# Patient Record
Sex: Male | Born: 1980 | Hispanic: Yes | Marital: Married | Smoking: Former smoker
Health system: Southern US, Community
[De-identification: ages and names within clinical notes are randomized; demographics above are authoritative.]

## PROBLEM LIST (undated history)

## (undated) DIAGNOSIS — I10 Essential (primary) hypertension: Secondary | ICD-10-CM

## (undated) DIAGNOSIS — I00 Rheumatic fever without heart involvement: Secondary | ICD-10-CM

## (undated) HISTORY — DX: Rheumatic fever without heart involvement: I00

---

## 2012-04-07 ENCOUNTER — Emergency Department (HOSPITAL_COMMUNITY)
Admission: EM | Admit: 2012-04-07 | Discharge: 2012-04-07 | Disposition: A | Discharge status: Home / Self Care | Payer: Self-pay | Attending: Emergency Medicine | Admitting: Emergency Medicine

## 2012-04-07 ENCOUNTER — Emergency Department (HOSPITAL_COMMUNITY): Payer: Self-pay

## 2012-04-07 ENCOUNTER — Encounter (HOSPITAL_COMMUNITY): Payer: Self-pay | Admitting: Emergency Medicine

## 2012-04-07 DIAGNOSIS — R112 Nausea with vomiting, unspecified: Secondary | ICD-10-CM | POA: Insufficient documentation

## 2012-04-07 DIAGNOSIS — R1032 Left lower quadrant pain: Secondary | ICD-10-CM | POA: Insufficient documentation

## 2012-04-07 DIAGNOSIS — R824 Acetonuria: Secondary | ICD-10-CM

## 2012-04-07 DIAGNOSIS — D72829 Elevated white blood cell count, unspecified: Secondary | ICD-10-CM

## 2012-04-07 DIAGNOSIS — R197 Diarrhea, unspecified: Secondary | ICD-10-CM | POA: Insufficient documentation

## 2012-04-07 LAB — CBC WITH DIFFERENTIAL/PLATELET
Basophils Relative: 0 % (ref 0–1)
Eosinophils Absolute: 0 10*3/uL (ref 0.0–0.7)
HCT: 50.9 % (ref 39.0–52.0)
Hemoglobin: 17 g/dL (ref 13.0–17.0)
MCH: 30 pg (ref 26.0–34.0)
MCHC: 33.4 g/dL (ref 30.0–36.0)
Monocytes Absolute: 0.9 10*3/uL (ref 0.1–1.0)
Monocytes Relative: 5 % (ref 3–12)

## 2012-04-07 LAB — URINALYSIS, ROUTINE W REFLEX MICROSCOPIC
Bilirubin Urine: NEGATIVE
Glucose, UA: 100 mg/dL — AB
Hgb urine dipstick: NEGATIVE
Ketones, ur: 15 mg/dL — AB
pH: 8 (ref 5.0–8.0)

## 2012-04-07 LAB — COMPREHENSIVE METABOLIC PANEL
Albumin: 4.9 g/dL (ref 3.5–5.2)
BUN: 12 mg/dL (ref 6–23)
Chloride: 101 mEq/L (ref 96–112)
Creatinine, Ser: 0.76 mg/dL (ref 0.50–1.35)
GFR calc Af Amer: >90 mL/min (ref >90)
Glucose, Bld: 150 mg/dL — ABNORMAL HIGH (ref 70–99)
Total Bilirubin: 0.4 mg/dL (ref 0.3–1.2)
Total Protein: 8.4 g/dL — ABNORMAL HIGH (ref 6.0–8.3)

## 2012-04-07 LAB — URINE MICROSCOPIC-ADD ON

## 2012-04-07 LAB — LIPASE, BLOOD: Lipase: 18 U/L (ref 11–59)

## 2012-04-07 MED ORDER — ONDANSETRON HCL 4 MG/2ML IJ SOLN
INTRAMUSCULAR | Status: AC
  Filled 2012-04-07: qty 2

## 2012-04-07 MED ORDER — ONDANSETRON HCL 4 MG PO TABS: 4.0000 mg | ORAL_TABLET | Freq: Four times a day (QID) | ORAL | Status: AC

## 2012-04-07 MED ORDER — HYDROCODONE-ACETAMINOPHEN 5-325 MG PO TABS: 1.0000 | ORAL_TABLET | ORAL | Status: AC | PRN

## 2012-04-07 MED ORDER — ONDANSETRON HCL 4 MG/2ML IJ SOLN
4.0000 mg | Freq: Once | INTRAMUSCULAR | Status: AC
  Administered 2012-04-07: 4 mg via INTRAVENOUS
  Filled 2012-04-07: qty 2

## 2012-04-07 MED ORDER — ONDANSETRON HCL 4 MG/2ML IJ SOLN
4.0000 mg | Freq: Once | INTRAMUSCULAR | Status: AC
  Administered 2012-04-07: 4 mg via INTRAVENOUS

## 2012-04-07 MED ORDER — IOHEXOL 300 MG/ML  SOLN: 20.0000 mL | INTRAMUSCULAR | Status: AC

## 2012-04-07 MED ORDER — MORPHINE SULFATE 4 MG/ML IJ SOLN
4.0000 mg | Freq: Once | INTRAMUSCULAR | Status: AC
  Administered 2012-04-07: 4 mg via INTRAVENOUS
  Filled 2012-04-07: qty 1

## 2012-04-07 MED ORDER — HYDROMORPHONE HCL PF 1 MG/ML IJ SOLN
1.0000 mg | Freq: Once | INTRAMUSCULAR | Status: AC
  Administered 2012-04-07: 1 mg via INTRAVENOUS
  Filled 2012-04-07: qty 1

## 2012-04-07 MED ORDER — IOHEXOL 300 MG/ML  SOLN
100.0000 mL | Freq: Once | INTRAMUSCULAR | Status: AC | PRN
  Administered 2012-04-07: 100 mL via INTRAVENOUS

## 2012-04-07 NOTE — ED Notes (Signed)
Family asked for pt lab results/ advised that I would Notified PA to consult to pt

## 2012-04-07 NOTE — ED Notes (Signed)
Oral contrast given to patient

## 2012-04-07 NOTE — ED Notes (Signed)
Pt writhing in chair

## 2012-04-07 NOTE — ED Notes (Signed)
abd pain cramping and vomiting bad since last night

## 2012-04-07 NOTE — ED Provider Notes (Signed)
Pain is improved with Dilaudid. Patient confirms history of onset of LLQ pain last night, waxing/waning, N, V. NO fever. No history of similar symptoms.   Waiting for CT ABd.  9:20 - No further pain medications required. He had some vomiting with CT CM x 1.Abd. nontender on re-evaluation. CT negative. Patient can be discharged home.  Rodena Medin, PA-C 04/07/12 2124

## 2012-04-07 NOTE — ED Provider Notes (Signed)
History     CSN: [id number]  Arrival date & time 04/07/12  1348   First MD Initiated Contact with Patient 04/07/12 1436      Chief Complaint  Patient presents with  . Abdominal Pain    (Consider location/radiation/quality/duration/timing/severity/associated sxs/prior treatment) HPI Comments: Joshua Hansen 31 y.o. male   The chief complaint is: Patient presents with:   Abdominal Pain   The patient has medical history significant for:   History reviewed. No pertinent past medical history.  Patient presents with abdominal pain that began last night. Patient states the pain is diffuse but mostly in the LLQ. Patient reports the pain as 10/10 without radiation or transmision. Associated symptoms include subjective fever, chills diaphoresis, vomiting X 10, and diarrhea X 3. Denies hematemesis, hematochezia, or melena. Denies dysuria, hematuria, or flank pain.     The history is provided by the patient and a significant other. The history is limited by a language barrier. A language interpreter was used (Myself and the girlfriend).    History reviewed. No pertinent past medical history.  History reviewed. No pertinent past surgical history.  No family history on file.  History  Substance Use Topics  . Smoking status: Never Smoker   . Smokeless tobacco: Not on file  . Alcohol Use: Yes      Review of Systems  Constitutional: Positive for fever, chills and diaphoresis.  Gastrointestinal: Positive for nausea, vomiting, abdominal pain and diarrhea. Negative for anal bleeding.  Genitourinary: Negative for dysuria, urgency, flank pain and difficulty urinating.  All other systems reviewed and are negative.    Allergies  Review of patient's allergies indicates no known allergies.  Home Medications   Current Outpatient Rx  Name Route Sig Dispense Refill  . BISMUTH SUBSALICYLATE 262 MG/15ML PO SUSP Oral Take 30 mLs by mouth once. Abdominal pain      BP 151/88  Pulse  82  Temp 98.2 F (36.8 C)  Resp 16  SpO2 100%  Physical Exam  Constitutional: He appears well-developed and well-nourished. He appears distressed.       Patient retching and holding abdomen.  HENT:  Head: Normocephalic and atraumatic.  Mouth/Throat: Oropharynx is clear and moist.  Eyes: Conjunctivae and EOM are normal. No scleral icterus.  Neck: Normal range of motion. Neck supple.  Cardiovascular: Normal rate, regular rhythm and normal heart sounds.   Pulmonary/Chest: Effort normal and breath sounds normal.  Abdominal: Soft. He exhibits no distension and no mass. There is tenderness in the left lower quadrant. There is guarding. There is no rigidity, no rebound, no CVA tenderness and no tenderness at McBurney's point.    Neurological: He is alert.  Skin: Skin is warm and dry.    ED Course  Procedures (including critical care time)  Labs Reviewed  URINALYSIS, ROUTINE W REFLEX MICROSCOPIC - Abnormal; Notable for the following:    APPearance HAZY (*)     Glucose, UA 100 (*)     Ketones, ur 15 (*)     Protein, ur 30 (*)     Leukocytes, UA SMALL (*)     All other components within normal limits  CBC WITH DIFFERENTIAL - Abnormal; Notable for the following:    WBC 18.1 (*)     Neutrophils Relative 86 (*)     Neutro Abs 15.5 (*)     Lymphocytes Relative 9 (*)     All other components within normal limits  COMPREHENSIVE METABOLIC PANEL - Abnormal; Notable for the following:  CO2 18 (*)     Glucose, Bld 150 (*)     Total Protein 8.4 (*)     All other components within normal limits  URINE MICROSCOPIC-ADD ON - Abnormal; Notable for the following:    Bacteria, UA FEW (*)     All other components within normal limits  LIPASE, BLOOD   Results for orders placed during the hospital encounter of 04/07/12  URINALYSIS, ROUTINE W REFLEX MICROSCOPIC      Component Value Range   Color, Urine YELLOW  YELLOW   APPearance HAZY (*) CLEAR   Specific Gravity, Urine 1.025  1.005 - 1.030     pH 8.0  5.0 - 8.0   Glucose, UA 100 (*) NEGATIVE mg/dL   Hgb urine dipstick NEGATIVE  NEGATIVE   Bilirubin Urine NEGATIVE  NEGATIVE   Ketones, ur 15 (*) NEGATIVE mg/dL   Protein, ur 30 (*) NEGATIVE mg/dL   Urobilinogen, UA 0.2  0.0 - 1.0 mg/dL   Nitrite NEGATIVE  NEGATIVE   Leukocytes, UA SMALL (*) NEGATIVE  CBC WITH DIFFERENTIAL      Component Value Range   WBC 18.1 (*) 4.0 - 10.5 K/uL   RBC 5.66  4.22 - 5.81 MIL/uL   Hemoglobin 17.0  13.0 - 17.0 g/dL   HCT 16.1  09.6 - 04.5 %   MCV 89.9  78.0 - 100.0 fL   MCH 30.0  26.0 - 34.0 pg   MCHC 33.4  30.0 - 36.0 g/dL   RDW 40.9  81.1 - 91.4 %   Platelets 274  150 - 400 K/uL   Neutrophils Relative 86 (*) 43 - 77 %   Neutro Abs 15.5 (*) 1.7 - 7.7 K/uL   Lymphocytes Relative 9 (*) 12 - 46 %   Lymphs Abs 1.6  0.7 - 4.0 K/uL   Monocytes Relative 5  3 - 12 %   Monocytes Absolute 0.9  0.1 - 1.0 K/uL   Eosinophils Relative 0  0 - 5 %   Eosinophils Absolute 0.0  0.0 - 0.7 K/uL   Basophils Relative 0  0 - 1 %   Basophils Absolute 0.0  0.0 - 0.1 K/uL  COMPREHENSIVE METABOLIC PANEL      Component Value Range   Sodium 137  135 - 145 mEq/L   Potassium 3.6  3.5 - 5.1 mEq/L   Chloride 101  96 - 112 mEq/L   CO2 18 (*) 19 - 32 mEq/L   Glucose, Bld 150 (*) 70 - 99 mg/dL   BUN 12  6 - 23 mg/dL   Creatinine, Ser 7.82  0.50 - 1.35 mg/dL   Calcium 95.6  8.4 - 21.3 mg/dL   Total Protein 8.4 (*) 6.0 - 8.3 g/dL   Albumin 4.9  3.5 - 5.2 g/dL   AST 17  0 - 37 U/L   ALT 23  0 - 53 U/L   Alkaline Phosphatase 78  39 - 117 U/L   Total Bilirubin 0.4  0.3 - 1.2 mg/dL   GFR calc non Af Amer >90  >90 mL/min   GFR calc Af Amer >90  >90 mL/min  URINE MICROSCOPIC-ADD ON      Component Value Range   WBC, UA 7-10  <3 WBC/hpf   RBC / HPF 0-2  <3 RBC/hpf   Bacteria, UA FEW (*) RARE   Urine-Other MUCOUS PRESENT      No results found.   1. Abdominal pain, left lower quadrant   2. Leukocytosis  3. Urine ketones       MDM  Patient presented with  nausea and abdominal pain 10/10. Patient given morphine, dilaudid, and zofran. Patient also given IV fluids. CMP: unremarkable. UA: remarkable for dehydration. CBC: remarkable for leukocytosis. Lipase: pending. CT abdomen pelvis with contrast pending. Patient transferred to CDU per recommendation of Dr. Jeraldine Loots and Elpidio Anis, PA-C will assume care while patient waits on imaging.         Pixie Casino, PA-C 04/07/12 1624

## 2012-04-07 NOTE — ED Notes (Signed)
Patient states pain intermittent at rest and at times pain is 0/10 when pain returned pain increases to 8-10/10 achy sharp entire abdomen.  Currently resting on stretcher and pain is 0/10.  Girlfriend at bedside.

## 2012-04-07 NOTE — ED Notes (Signed)
Pt is back in room from radiology 

## 2012-04-07 NOTE — ED Notes (Signed)
Called CT will be sending oral contrast shortly

## 2012-04-07 NOTE — ED Notes (Signed)
Pt denies CP/SOB. 

## 2012-04-07 NOTE — ED Notes (Signed)
CT called to check to see if pt finished Contrast/ advised that he has and they could come transport him to radiology

## 2012-04-07 NOTE — ED Provider Notes (Signed)
Medical screening examination/treatment/procedure(s) were conducted as a shared visit with non-physician practitioner(s) and myself.  I personally evaluated the patient during the encounter   Gerhard Munch, MD 04/07/12 [phone]

## 2012-04-08 NOTE — ED Provider Notes (Signed)
Please see the initial treatment note.  I was available for consult for this patient who I sent to the CDU for completion of his evaluation for new abdominal pain.  Gerhard Munch, MD 04/08/12 [phone]

## 2018-08-06 ENCOUNTER — Other Ambulatory Visit: Payer: Self-pay

## 2018-08-06 ENCOUNTER — Inpatient Hospital Stay (HOSPITAL_COMMUNITY): Payer: No Typology Code available for payment source | Admitting: Certified Registered"

## 2018-08-06 ENCOUNTER — Emergency Department (HOSPITAL_COMMUNITY): Payer: No Typology Code available for payment source

## 2018-08-06 ENCOUNTER — Inpatient Hospital Stay (HOSPITAL_COMMUNITY)
Admission: EM | Admit: 2018-08-06 | Discharge: 2018-08-07 | DRG: 958 | Disposition: A | Discharge status: Other Acute Inpatient Hospital | Payer: No Typology Code available for payment source | Attending: General Surgery | Admitting: General Surgery

## 2018-08-06 ENCOUNTER — Inpatient Hospital Stay (HOSPITAL_COMMUNITY): Payer: No Typology Code available for payment source

## 2018-08-06 ENCOUNTER — Encounter (HOSPITAL_COMMUNITY)
Admission: EM | Disposition: A | Discharge status: Other Acute Inpatient Hospital | Payer: Self-pay | Source: Home / Self Care

## 2018-08-06 ENCOUNTER — Encounter (HOSPITAL_COMMUNITY): Payer: Self-pay | Admitting: Emergency Medicine

## 2018-08-06 DIAGNOSIS — S82202B Unspecified fracture of shaft of left tibia, initial encounter for open fracture type I or II: Secondary | ICD-10-CM | POA: Diagnosis present

## 2018-08-06 DIAGNOSIS — Z419 Encounter for procedure for purposes other than remedying health state, unspecified: Secondary | ICD-10-CM

## 2018-08-06 DIAGNOSIS — S82452C Displaced comminuted fracture of shaft of left fibula, initial encounter for open fracture type IIIA, IIIB, or IIIC: Secondary | ICD-10-CM | POA: Diagnosis present

## 2018-08-06 DIAGNOSIS — I10 Essential (primary) hypertension: Secondary | ICD-10-CM | POA: Diagnosis present

## 2018-08-06 DIAGNOSIS — S82392C Other fracture of lower end of left tibia, initial encounter for open fracture type IIIA, IIIB, or IIIC: Secondary | ICD-10-CM

## 2018-08-06 DIAGNOSIS — S82232C Displaced oblique fracture of shaft of left tibia, initial encounter for open fracture type IIIA, IIIB, or IIIC: Secondary | ICD-10-CM

## 2018-08-06 DIAGNOSIS — S36522A Contusion of descending [left] colon, initial encounter: Secondary | ICD-10-CM | POA: Diagnosis present

## 2018-08-06 DIAGNOSIS — S301XXA Contusion of abdominal wall, initial encounter: Secondary | ICD-10-CM | POA: Diagnosis present

## 2018-08-06 DIAGNOSIS — Y92410 Unspecified street and highway as the place of occurrence of the external cause: Secondary | ICD-10-CM

## 2018-08-06 DIAGNOSIS — S82192C Other fracture of upper end of left tibia, initial encounter for open fracture type IIIA, IIIB, or IIIC: Secondary | ICD-10-CM | POA: Diagnosis present

## 2018-08-06 DIAGNOSIS — S42255A Nondisplaced fracture of greater tuberosity of left humerus, initial encounter for closed fracture: Principal | ICD-10-CM | POA: Diagnosis present

## 2018-08-06 DIAGNOSIS — S36529A Contusion of unspecified part of colon, initial encounter: Secondary | ICD-10-CM

## 2018-08-06 DIAGNOSIS — S82302C Unspecified fracture of lower end of left tibia, initial encounter for open fracture type IIIA, IIIB, or IIIC: Secondary | ICD-10-CM | POA: Diagnosis present

## 2018-08-06 DIAGNOSIS — Z23 Encounter for immunization: Secondary | ICD-10-CM

## 2018-08-06 DIAGNOSIS — Z79899 Other long term (current) drug therapy: Secondary | ICD-10-CM

## 2018-08-06 HISTORY — PX: I & D EXTREMITY: SHX5045

## 2018-08-06 HISTORY — PX: TIBIA IM NAIL INSERTION: SHX2516

## 2018-08-06 HISTORY — DX: Essential (primary) hypertension: I10

## 2018-08-06 HISTORY — DX: Unspecified fracture of shaft of left tibia, initial encounter for open fracture type I or II: S82.202B

## 2018-08-06 LAB — URINALYSIS, ROUTINE W REFLEX MICROSCOPIC
BACTERIA UA: NONE SEEN
Bilirubin Urine: NEGATIVE
Glucose, UA: NEGATIVE mg/dL
Ketones, ur: 5 mg/dL — AB
LEUKOCYTES UA: NEGATIVE
Nitrite: NEGATIVE
Protein, ur: NEGATIVE mg/dL
SPECIFIC GRAVITY, URINE: 1.035 — AB (ref 1.005–1.030)
pH: 5 (ref 5.0–8.0)

## 2018-08-06 LAB — I-STAT CHEM 8, ED
BUN: 20 mg/dL (ref 6–20)
Calcium, Ion: 1.12 mmol/L — ABNORMAL LOW (ref 1.15–1.40)
Chloride: 105 mmol/L (ref 98–111)
Creatinine, Ser: 1 mg/dL (ref 0.61–1.24)
Glucose, Bld: 148 mg/dL — ABNORMAL HIGH (ref 70–99)
HCT: 47 % (ref 39.0–52.0)
Hemoglobin: 16 g/dL (ref 13.0–17.0)
Potassium: 3.1 mmol/L — ABNORMAL LOW (ref 3.5–5.1)
Sodium: 140 mmol/L (ref 135–145)
TCO2: 23 mmol/L (ref 22–32)

## 2018-08-06 LAB — COMPREHENSIVE METABOLIC PANEL
ALT: 31 U/L (ref 0–44)
AST: 39 U/L (ref 15–41)
Albumin: 4.1 g/dL (ref 3.5–5.0)
Alkaline Phosphatase: 50 U/L (ref 38–126)
Anion gap: 11 (ref 5–15)
BUN: 16 mg/dL (ref 6–20)
CO2: 22 mmol/L (ref 22–32)
Calcium: 9 mg/dL (ref 8.9–10.3)
Chloride: 107 mmol/L (ref 98–111)
Creatinine, Ser: 1.13 mg/dL (ref 0.61–1.24)
GFR calc Af Amer: >60 mL/min (ref >60)
GFR calc non Af Amer: >60 mL/min (ref >60)
Glucose, Bld: 148 mg/dL — ABNORMAL HIGH (ref 70–99)
Potassium: 3.1 mmol/L — ABNORMAL LOW (ref 3.5–5.1)
Sodium: 140 mmol/L (ref 135–145)
Total Bilirubin: 0.6 mg/dL (ref 0.3–1.2)
Total Protein: 7 g/dL (ref 6.5–8.1)

## 2018-08-06 LAB — ETHANOL: Alcohol, Ethyl (B): <10 mg/dL (ref <10)

## 2018-08-06 LAB — CDS SEROLOGY

## 2018-08-06 LAB — SAMPLE TO BLOOD BANK

## 2018-08-06 LAB — PROTIME-INR
INR: 1.16
Prothrombin Time: 14.7 seconds (ref 11.4–15.2)

## 2018-08-06 LAB — I-STAT CG4 LACTIC ACID, ED: Lactic Acid, Venous: 3.99 mmol/L (ref 0.5–1.9)

## 2018-08-06 LAB — CBC
HEMATOCRIT: 47 % (ref 39.0–52.0)
Hemoglobin: 14.6 g/dL (ref 13.0–17.0)
MCH: 28.9 pg (ref 26.0–34.0)
MCHC: 31.1 g/dL (ref 30.0–36.0)
MCV: 93.1 fL (ref 80.0–100.0)
Platelets: 441 10*3/uL — ABNORMAL HIGH (ref 150–400)
RBC: 5.05 MIL/uL (ref 4.22–5.81)
RDW: 13.1 % (ref 11.5–15.5)
WBC: 29.7 10*3/uL — ABNORMAL HIGH (ref 4.0–10.5)
nRBC: 0 % (ref 0.0–0.2)

## 2018-08-06 SURGERY — IRRIGATION AND DEBRIDEMENT EXTREMITY
Anesthesia: General | Laterality: Left

## 2018-08-06 MED ORDER — SUCCINYLCHOLINE CHLORIDE 200 MG/10ML IV SOSY
PREFILLED_SYRINGE | INTRAVENOUS | Status: AC
  Filled 2018-08-06: qty 10

## 2018-08-06 MED ORDER — HYDROMORPHONE HCL 1 MG/ML IJ SOLN
0.2500 mg | INTRAMUSCULAR | Status: DC | PRN
  Administered 2018-08-07 (×2): 0.5 mg via INTRAVENOUS

## 2018-08-06 MED ORDER — SUFENTANIL CITRATE 50 MCG/ML IV SOLN
INTRAVENOUS | Status: DC | PRN
  Administered 2018-08-06: 20 ug via INTRAVENOUS
  Administered 2018-08-06: 10 ug via INTRAVENOUS

## 2018-08-06 MED ORDER — EPHEDRINE SULFATE 50 MG/ML IJ SOLN
INTRAMUSCULAR | Status: DC | PRN
  Administered 2018-08-06 (×2): 10 mg via INTRAVENOUS

## 2018-08-06 MED ORDER — IOHEXOL 300 MG/ML  SOLN
100.0000 mL | Freq: Once | INTRAMUSCULAR | Status: AC | PRN
  Administered 2018-08-06: 100 mL via INTRAVENOUS

## 2018-08-06 MED ORDER — PROPOFOL 10 MG/ML IV BOLUS
INTRAVENOUS | Status: DC | PRN
  Administered 2018-08-06: 200 mg via INTRAVENOUS

## 2018-08-06 MED ORDER — GLYCOPYRROLATE 0.2 MG/ML IJ SOLN
INTRAMUSCULAR | Status: DC | PRN
  Administered 2018-08-06: 0.2 mg via INTRAVENOUS

## 2018-08-06 MED ORDER — LORAZEPAM 2 MG/ML IJ SOLN
INTRAMUSCULAR | Status: AC | PRN
  Administered 2018-08-06 (×2): 1 mg via INTRAVENOUS

## 2018-08-06 MED ORDER — ONDANSETRON HCL 4 MG/2ML IJ SOLN
INTRAMUSCULAR | Status: AC
  Filled 2018-08-06: qty 2

## 2018-08-06 MED ORDER — GLYCOPYRROLATE PF 0.2 MG/ML IJ SOSY
PREFILLED_SYRINGE | INTRAMUSCULAR | Status: AC
  Filled 2018-08-06: qty 1

## 2018-08-06 MED ORDER — HYDROMORPHONE HCL 1 MG/ML IJ SOLN
INTRAMUSCULAR | Status: AC | PRN
  Administered 2018-08-06: 1 mg via INTRAVENOUS

## 2018-08-06 MED ORDER — DEXAMETHASONE SODIUM PHOSPHATE 10 MG/ML IJ SOLN
INTRAMUSCULAR | Status: AC
  Filled 2018-08-06: qty 1

## 2018-08-06 MED ORDER — HYDROMORPHONE HCL 1 MG/ML IJ SOLN
INTRAMUSCULAR | Status: AC
  Filled 2018-08-06: qty 1

## 2018-08-06 MED ORDER — SUFENTANIL CITRATE 50 MCG/ML IV SOLN
INTRAVENOUS | Status: AC
  Filled 2018-08-06: qty 1

## 2018-08-06 MED ORDER — SODIUM CHLORIDE 0.9 % IV BOLUS
1000.0000 mL | Freq: Once | INTRAVENOUS | Status: AC
  Administered 2018-08-06: 1000 mL via INTRAVENOUS

## 2018-08-06 MED ORDER — MIDAZOLAM HCL 2 MG/2ML IJ SOLN
INTRAMUSCULAR | Status: AC
  Filled 2018-08-06: qty 2

## 2018-08-06 MED ORDER — ALBUMIN HUMAN 5 % IV SOLN
INTRAVENOUS | Status: DC | PRN
  Administered 2018-08-06: 22:00:00 via INTRAVENOUS

## 2018-08-06 MED ORDER — PROPOFOL 10 MG/ML IV BOLUS
INTRAVENOUS | Status: AC
  Filled 2018-08-06: qty 20

## 2018-08-06 MED ORDER — LACTATED RINGERS IV SOLN
INTRAVENOUS | Status: DC | PRN
  Administered 2018-08-06 (×2): via INTRAVENOUS

## 2018-08-06 MED ORDER — SODIUM CHLORIDE (PF) 0.9 % IJ SOLN
INTRAMUSCULAR | Status: AC
  Filled 2018-08-06: qty 10

## 2018-08-06 MED ORDER — POTASSIUM CHLORIDE IN NACL 20-0.9 MEQ/L-% IV SOLN
INTRAVENOUS | Status: DC
  Administered 2018-08-07: 04:00:00 via INTRAVENOUS
  Filled 2018-08-06 (×2): qty 1000

## 2018-08-06 MED ORDER — MIDAZOLAM HCL 2 MG/2ML IJ SOLN: 0.5000 mg | Freq: Once | INTRAMUSCULAR | Status: DC | PRN

## 2018-08-06 MED ORDER — SODIUM CHLORIDE 0.9 % IR SOLN
Status: DC | PRN
  Administered 2018-08-06 (×2): 3000 mL

## 2018-08-06 MED ORDER — TETANUS-DIPHTH-ACELL PERTUSSIS 5-2.5-18.5 LF-MCG/0.5 IM SUSP
0.5000 mL | Freq: Once | INTRAMUSCULAR | Status: AC
  Administered 2018-08-06: 0.5 mL via INTRAMUSCULAR
  Filled 2018-08-06: qty 0.5

## 2018-08-06 MED ORDER — CEFAZOLIN SODIUM-DEXTROSE 1-4 GM/50ML-% IV SOLN
INTRAVENOUS | Status: DC | PRN
  Administered 2018-08-06: 1 g via INTRAVENOUS

## 2018-08-06 MED ORDER — LORAZEPAM 2 MG/ML IJ SOLN
INTRAMUSCULAR | Status: AC
  Filled 2018-08-06: qty 2

## 2018-08-06 MED ORDER — LIDOCAINE 2% (20 MG/ML) 5 ML SYRINGE
INTRAMUSCULAR | Status: DC | PRN
  Administered 2018-08-06: 100 mg via INTRAVENOUS

## 2018-08-06 MED ORDER — EPHEDRINE 5 MG/ML INJ
INTRAVENOUS | Status: AC
  Filled 2018-08-06: qty 10

## 2018-08-06 MED ORDER — LIDOCAINE 2% (20 MG/ML) 5 ML SYRINGE
INTRAMUSCULAR | Status: AC
  Filled 2018-08-06: qty 5

## 2018-08-06 MED ORDER — HYDROMORPHONE HCL 1 MG/ML IJ SOLN
1.0000 mg | Freq: Once | INTRAMUSCULAR | Status: DC
  Filled 2018-08-06: qty 1

## 2018-08-06 MED ORDER — MEPERIDINE HCL 50 MG/ML IJ SOLN: 6.2500 mg | INTRAMUSCULAR | Status: DC | PRN

## 2018-08-06 MED ORDER — DEXAMETHASONE SODIUM PHOSPHATE 10 MG/ML IJ SOLN
INTRAMUSCULAR | Status: DC | PRN
  Administered 2018-08-06: 10 mg via INTRAVENOUS

## 2018-08-06 MED ORDER — SODIUM CHLORIDE 0.9 % IV SOLN
INTRAVENOUS | Status: AC | PRN
  Administered 2018-08-06: 1000 mL via INTRAVENOUS

## 2018-08-06 MED ORDER — ONDANSETRON HCL 4 MG/2ML IJ SOLN
INTRAMUSCULAR | Status: DC | PRN
  Administered 2018-08-06: 4 mg via INTRAVENOUS

## 2018-08-06 MED ORDER — HYDROMORPHONE HCL 1 MG/ML IJ SOLN
1.0000 mg | Freq: Once | INTRAMUSCULAR | Status: AC
  Administered 2018-08-06: 1 mg via INTRAVENOUS
  Filled 2018-08-06: qty 1

## 2018-08-06 MED ORDER — CEFAZOLIN SODIUM-DEXTROSE 2-4 GM/100ML-% IV SOLN
2.0000 g | Freq: Once | INTRAVENOUS | Status: AC
  Administered 2018-08-06: 2 g via INTRAVENOUS
  Filled 2018-08-06: qty 100

## 2018-08-06 MED ORDER — PROMETHAZINE HCL 25 MG/ML IJ SOLN: 6.2500 mg | INTRAMUSCULAR | Status: DC | PRN

## 2018-08-06 MED ORDER — SUCCINYLCHOLINE CHLORIDE 200 MG/10ML IV SOSY
PREFILLED_SYRINGE | INTRAVENOUS | Status: DC | PRN
  Administered 2018-08-06: 120 mg via INTRAVENOUS

## 2018-08-06 MED ORDER — CEFAZOLIN SODIUM-DEXTROSE 2-4 GM/100ML-% IV SOLN
INTRAVENOUS | Status: AC
  Filled 2018-08-06: qty 100

## 2018-08-06 SURGICAL SUPPLY — 103 items
ALCOHOL 70% 16 OZ (MISCELLANEOUS) IMPLANT
BANDAGE ACE 3X5.8 VEL STRL LF (GAUZE/BANDAGES/DRESSINGS) IMPLANT
BANDAGE ACE 4X5 VEL STRL LF (GAUZE/BANDAGES/DRESSINGS) ×3 IMPLANT
BANDAGE ACE 6X5 VEL STRL LF (GAUZE/BANDAGES/DRESSINGS) ×3 IMPLANT
BANDAGE ELASTIC 4 VELCRO ST LF (GAUZE/BANDAGES/DRESSINGS) ×6 IMPLANT
BANDAGE ELASTIC 6 VELCRO ST LF (GAUZE/BANDAGES/DRESSINGS) ×3 IMPLANT
BANDAGE ESMARK 6X9 LF (GAUZE/BANDAGES/DRESSINGS) ×1 IMPLANT
BIT DRILL CALIBRATED 4.2 (BIT) ×1 IMPLANT
BIT DRILL SHORT 4.2 (BIT) ×2 IMPLANT
BLADE SURG 10 STRL SS (BLADE) ×3 IMPLANT
BNDG COHESIVE 1X5 TAN STRL LF (GAUZE/BANDAGES/DRESSINGS) IMPLANT
BNDG COHESIVE 4X5 TAN STRL (GAUZE/BANDAGES/DRESSINGS) ×3 IMPLANT
BNDG COHESIVE 6X5 TAN STRL LF (GAUZE/BANDAGES/DRESSINGS) ×6 IMPLANT
BNDG CONFORM 3 STRL LF (GAUZE/BANDAGES/DRESSINGS) IMPLANT
BNDG ELASTIC 6X15 VLCR STRL LF (GAUZE/BANDAGES/DRESSINGS) ×3 IMPLANT
BNDG ESMARK 6X9 LF (GAUZE/BANDAGES/DRESSINGS) ×3
BNDG GAUZE STRTCH 6 (GAUZE/BANDAGES/DRESSINGS) ×9 IMPLANT
CORDS BIPOLAR (ELECTRODE) IMPLANT
COVER MAYO STAND STRL (DRAPES) ×3 IMPLANT
COVER SURGICAL LIGHT HANDLE (MISCELLANEOUS) ×3 IMPLANT
COVER WAND RF STERILE (DRAPES) IMPLANT
CUFF TOURNIQUET SINGLE 24IN (TOURNIQUET CUFF) IMPLANT
CUFF TOURNIQUET SINGLE 34IN LL (TOURNIQUET CUFF) IMPLANT
CUFF TOURNIQUET SINGLE 44IN (TOURNIQUET CUFF) IMPLANT
DRAPE C-ARM 42X72 X-RAY (DRAPES) ×3 IMPLANT
DRAPE C-ARMOR (DRAPES) ×3 IMPLANT
DRAPE EXTREMITY BILATERAL (DRAPES) IMPLANT
DRAPE HALF SHEET 40X57 (DRAPES) ×3 IMPLANT
DRAPE IMP U-DRAPE 54X76 (DRAPES) ×3 IMPLANT
DRAPE INCISE IOBAN 66X45 STRL (DRAPES) ×3 IMPLANT
DRAPE POUCH INSTRU U-SHP 10X18 (DRAPES) ×3 IMPLANT
DRAPE SURG 17X23 STRL (DRAPES) IMPLANT
DRAPE U-SHAPE 47X51 STRL (DRAPES) ×3 IMPLANT
DRAPE UTILITY XL STRL (DRAPES) ×6 IMPLANT
DRILL BIT CALIBRATED 4.2 (BIT) ×3
DRILL BIT SHORT 4.2 (BIT) ×4
DRSG PAD ABDOMINAL 8X10 ST (GAUZE/BANDAGES/DRESSINGS) ×9 IMPLANT
DURAPREP 26ML APPLICATOR (WOUND CARE) IMPLANT
ELECT CAUTERY BLADE 6.4 (BLADE) ×3 IMPLANT
ELECT REM PT RETURN 9FT ADLT (ELECTROSURGICAL) ×3
ELECTRODE REM PT RTRN 9FT ADLT (ELECTROSURGICAL) ×1 IMPLANT
FACESHIELD WRAPAROUND (MASK) IMPLANT
GAUZE SPONGE 4X4 12PLY STRL (GAUZE/BANDAGES/DRESSINGS) ×6 IMPLANT
GAUZE XEROFORM 1X8 LF (GAUZE/BANDAGES/DRESSINGS) ×6 IMPLANT
GAUZE XEROFORM 5X9 LF (GAUZE/BANDAGES/DRESSINGS) ×6 IMPLANT
GLOVE BIO SURGEON STRL SZ 6.5 (GLOVE) ×2 IMPLANT
GLOVE BIO SURGEON STRL SZ7.5 (GLOVE) ×9 IMPLANT
GLOVE BIO SURGEONS STRL SZ 6.5 (GLOVE) ×1
GLOVE BIOGEL PI IND STRL 7.0 (GLOVE) ×1 IMPLANT
GLOVE BIOGEL PI IND STRL 8 (GLOVE) ×3 IMPLANT
GLOVE BIOGEL PI INDICATOR 7.0 (GLOVE) ×2
GLOVE BIOGEL PI INDICATOR 8 (GLOVE) ×6
GLOVE ECLIPSE 7.0 STRL STRAW (GLOVE) ×6 IMPLANT
GOWN STRL REUS W/ TWL LRG LVL3 (GOWN DISPOSABLE) ×2 IMPLANT
GOWN STRL REUS W/ TWL XL LVL3 (GOWN DISPOSABLE) ×1 IMPLANT
GOWN STRL REUS W/TWL LRG LVL3 (GOWN DISPOSABLE) ×4
GOWN STRL REUS W/TWL XL LVL3 (GOWN DISPOSABLE) ×2
GUIDEWIRE 3.2X400 (WIRE) ×3 IMPLANT
HANDPIECE INTERPULSE COAX TIP (DISPOSABLE)
KIT BASIN OR (CUSTOM PROCEDURE TRAY) ×3 IMPLANT
KIT TURNOVER KIT B (KITS) ×3 IMPLANT
MANIFOLD NEPTUNE II (INSTRUMENTS) ×3 IMPLANT
NAIL TIBIAL W/PROX BEND 10X345 (Nail) ×3 IMPLANT
NS IRRIG 1000ML POUR BTL (IV SOLUTION) ×6 IMPLANT
PACK ORTHO EXTREMITY (CUSTOM PROCEDURE TRAY) ×3 IMPLANT
PACK TOTAL JOINT (CUSTOM PROCEDURE TRAY) ×3 IMPLANT
PACK UNIVERSAL I (CUSTOM PROCEDURE TRAY) IMPLANT
PAD ABD 8X10 STRL (GAUZE/BANDAGES/DRESSINGS) ×3 IMPLANT
PAD ARMBOARD 7.5X6 YLW CONV (MISCELLANEOUS) ×6 IMPLANT
PAD CAST 4YDX4 CTTN HI CHSV (CAST SUPPLIES) ×3 IMPLANT
PADDING CAST ABS 4INX4YD NS (CAST SUPPLIES) ×4
PADDING CAST ABS COTTON 4X4 ST (CAST SUPPLIES) ×2 IMPLANT
PADDING CAST COTTON 4X4 STRL (CAST SUPPLIES) ×6
PADDING CAST COTTON 6X4 STRL (CAST SUPPLIES) ×3 IMPLANT
REAMER ROD DEEP FLUTE 2.5X950 (INSTRUMENTS) ×3 IMPLANT
SCREW LOCK STAR 5X32 (Screw) ×3 IMPLANT
SCREW LOCK STAR 5X40 (Screw) ×3 IMPLANT
SCREW LOCK STAR 5X42 (Screw) ×3 IMPLANT
SCREW LOCK STAR 5X52 (Screw) ×3 IMPLANT
SET CYSTO W/LG BORE CLAMP LF (SET/KITS/TRAYS/PACK) ×3 IMPLANT
SET HNDPC FAN SPRY TIP SCT (DISPOSABLE) IMPLANT
SPONGE LAP 18X18 X RAY DECT (DISPOSABLE) ×6 IMPLANT
STAPLER SKIN PROX WIDE 3.9 (STAPLE) ×6 IMPLANT
STOCKINETTE IMPERVIOUS 9X36 MD (GAUZE/BANDAGES/DRESSINGS) ×3 IMPLANT
SUT ETHILON 2 0 FS 18 (SUTURE) IMPLANT
SUT ETHILON 2 0 PSLX (SUTURE) IMPLANT
SUT ETHILON 3 0 PS 1 (SUTURE) ×6 IMPLANT
SUT MON AB 2-0 CT1 36 (SUTURE) ×6 IMPLANT
SUT VIC AB 0 CTX 36 (SUTURE) ×2
SUT VIC AB 0 CTX36XBRD ANTBCTR (SUTURE) ×1 IMPLANT
SUT VIC AB 2-0 CT1 36 (SUTURE) IMPLANT
SUT VIC AB 2-0 FS1 27 (SUTURE) IMPLANT
SWAB CULTURE ESWAB REG 1ML (MISCELLANEOUS) IMPLANT
SYR CONTROL 10ML LL (SYRINGE) IMPLANT
TOWEL OR 17X24 6PK STRL BLUE (TOWEL DISPOSABLE) ×3 IMPLANT
TOWEL OR 17X26 10 PK STRL BLUE (TOWEL DISPOSABLE) ×6 IMPLANT
TUBE CONNECTING 12'X1/4 (SUCTIONS) ×1
TUBE CONNECTING 12X1/4 (SUCTIONS) ×2 IMPLANT
TUBE FEEDING ENTERAL 5FR 16IN (TUBING) IMPLANT
TUBING CYSTO DISP (UROLOGICAL SUPPLIES) ×3 IMPLANT
UNDERPAD 30X30 (UNDERPADS AND DIAPERS) ×3 IMPLANT
WATER STERILE IRR 1000ML POUR (IV SOLUTION) ×3 IMPLANT
YANKAUER SUCT BULB TIP NO VENT (SUCTIONS) ×3 IMPLANT

## 2018-08-06 NOTE — Discharge Instructions (Signed)
Orthopedic discharge instructions:  -For the left upper extremity you should maintain your sling at all times other than when bathing, and performing other activities of daily living or doing elbow, hand, and wrist exercises.  You will need to maintain this until your follow-up appointment with Dr. Stann Mainland.  No weightbearing to the left arm.  -For the operative left lower extremity you should participate in only 50% weightbearing.  Following discharge from the hospital you should take a 325 mg aspirin twice daily for 6 weeks for the prevention of blood clots. -Elevate the left lower extremity as much as you can throughout the day with "toes above nose."  Apply ice to the incisions throughout the day as well. -You may remove the postoperative bandages on the fourth postoperative day and reapply standard over-the-counter sterile dressings as well as an Ace wrap to the leg to be changed once per day.  Do not Get the incisions wet until your follow-up appointment.  Follow-up with Dr. Stann Mainland in 2 weeks.

## 2018-08-06 NOTE — ED Notes (Signed)
I Stat [place] Acid result of 3.99 reported to Dr. Darl Householder

## 2018-08-06 NOTE — Anesthesia Procedure Notes (Signed)
Procedure Name: Intubation Date/Time: 08/06/2018 9:39 PM Performed by: Claris Che, CRNA Pre-anesthesia Checklist: Patient identified, Emergency Drugs available, Suction available, Patient being monitored and Timeout performed Patient Re-evaluated:Patient Re-evaluated prior to induction Oxygen Delivery Method: Circle system utilized Preoxygenation: Pre-oxygenation with 100% oxygen Induction Type: IV induction, Rapid sequence and Cricoid Pressure applied Laryngoscope Size: Mac and 3 Grade View: Grade II Tube type: Oral Tube size: 8.0 mm Number of attempts: 1 Airway Equipment and Method: Stylet Placement Confirmation: ETT inserted through vocal cords under direct vision,  positive ETCO2 and breath sounds checked- equal and bilateral Secured at: 24 cm Tube secured with: Tape Dental Injury: Teeth and Oropharynx as per pre-operative assessment
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
## 2018-08-06 NOTE — Progress Notes (Signed)
Orthopedic Tech Progress Note Patient Details:  Joshua Hansen [birth year] [id number]    Shoulder immobilizer with [name] Type of Ortho Device: Shoulder immobilizer, Short leg splint, Stirrup splint Splint Material: Fiberglass Ortho Device/Splint Location: Lt leg Ortho Device/Splint Interventions: Adjustment, Application, Ordered   Post Interventions Patient Tolerated: Well Instructions Provided: Care of device, Adjustment of device   Janit Pagan 08/06/2018, 8:51 PM
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
## 2018-08-06 NOTE — ED Notes (Signed)
Patient transported to CT 

## 2018-08-06 NOTE — ED Notes (Signed)
Pt transported to CT ?

## 2018-08-06 NOTE — ED Notes (Addendum)
Pt's pastor and mother in law at bedside to inform pt of daughter's condition.
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
## 2018-08-06 NOTE — Transfer of Care (Signed)
Immediate Anesthesia Transfer of Care Note  Patient: Joshua Hansen  Procedure(s) Performed: IRRIGATION AND DEBRIDEMENT EXTREMITY (Left ) INTRAMEDULLARY (IM) NAIL TIBIAL (Left )  Patient Location: PACU  Anesthesia Type:General  Level of Consciousness: sedated, drowsy, patient cooperative and responds to stimulation  Airway & Oxygen Therapy: Patient Spontanous Breathing  Post-op Assessment: Report given to RN, Post -op Vital signs reviewed and stable and Patient moving all extremities X 4  Post vital signs: Reviewed and stable  Last Vitals:  Vitals Value Taken Time  BP    Temp    Pulse    Resp    SpO2      Last Pain:  Vitals:   08/06/18 2056  PainSc: Asleep      Patients Stated Pain Goal: 6 (38/18/29 9371)  Complications: No apparent anesthesia complications

## 2018-08-06 NOTE — Progress Notes (Signed)
Orthopedic Tech Progress Note Patient Details:  Joshua Hansen 1981/02/23 [id number] Pt has a left open Tib/ fib and splint was applied to it with assistance from [place] Type of Ortho Device: Ace wrap, Post splint, Post (short leg) splint, Short leg splint, Stirrup splint Splint Material: Fiberglass Ortho Device/Splint Location: Lt leg Ortho Device/Splint Interventions: Application   Post Interventions Patient Tolerated: Well Instructions Provided: Adjustment of device, Care of device   Ladell Pier [facility] 08/06/2018, 6:14 PM

## 2018-08-06 NOTE — ED Notes (Addendum)
Caprice Beaver, pt's mother-in-law would like to leave her number.  Mrs. Idelia Salm notified pt of his daughter's poor prognosis.  She can be reached at [phone].
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
## 2018-08-06 NOTE — Brief Op Note (Signed)
08/06/2018  11:39 PM  PATIENT:  Joshua Hansen  38 y.o. male  PRE-OPERATIVE DIAGNOSIS:  LEFT OPEN TIBIA FRACTURE  POST-OPERATIVE DIAGNOSIS:  Left open tibia fracture  PROCEDURE:  Procedure(s): IRRIGATION AND DEBRIDEMENT EXTREMITY (Left) INTRAMEDULLARY (IM) NAIL TIBIAL (Left)  SURGEON:  Surgeon(s) and Role:    * Nicholes Stairs, MD - Primary  PHYSICIAN ASSISTANT:   ASSISTANTS: none   ANESTHESIA:   general  EBL:  50 mL   BLOOD ADMINISTERED:none  DRAINS: none   LOCAL MEDICATIONS USED:  NONE  SPECIMEN:  No Specimen  DISPOSITION OF SPECIMEN:  N/A  COUNTS:  YES  TOURNIQUET:  * No tourniquets in log *  DICTATION: .Note written in EPIC  PLAN OF CARE: Admit to inpatient   PATIENT DISPOSITION:  PACU - hemodynamically stable.   Delay start of Pharmacological VTE agent (>24hrs) due to surgical blood loss or risk of bleeding: not applicable

## 2018-08-06 NOTE — ED Notes (Signed)
Dr. Thompson at bedside. 
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
## 2018-08-07 ENCOUNTER — Inpatient Hospital Stay (HOSPITAL_COMMUNITY): Payer: No Typology Code available for payment source

## 2018-08-07 ENCOUNTER — Other Ambulatory Visit (HOSPITAL_COMMUNITY): Payer: Self-pay

## 2018-08-07 DIAGNOSIS — S42302A Unspecified fracture of shaft of humerus, left arm, initial encounter for closed fracture: Secondary | ICD-10-CM

## 2018-08-07 DIAGNOSIS — S82202B Unspecified fracture of shaft of left tibia, initial encounter for open fracture type I or II: Secondary | ICD-10-CM

## 2018-08-07 HISTORY — DX: Unspecified fracture of shaft of left tibia, initial encounter for open fracture type I or II: S82.202B

## 2018-08-07 HISTORY — DX: Unspecified fracture of shaft of humerus, left arm, initial encounter for closed fracture: S42.302A

## 2018-08-07 LAB — BASIC METABOLIC PANEL
Anion gap: 7 (ref 5–15)
BUN: 11 mg/dL (ref 6–20)
CO2: 21 mmol/L — ABNORMAL LOW (ref 22–32)
Calcium: 8.1 mg/dL — ABNORMAL LOW (ref 8.9–10.3)
Chloride: 108 mmol/L (ref 98–111)
Creatinine, Ser: 0.88 mg/dL (ref 0.61–1.24)
GFR calc Af Amer: >60 mL/min (ref >60)
GFR calc non Af Amer: >60 mL/min (ref >60)
Glucose, Bld: 191 mg/dL — ABNORMAL HIGH (ref 70–99)
Potassium: 3.7 mmol/L (ref 3.5–5.1)
Sodium: 136 mmol/L (ref 135–145)

## 2018-08-07 LAB — CBC
HCT: 37.9 % — ABNORMAL LOW (ref 39.0–52.0)
Hemoglobin: 12 g/dL — ABNORMAL LOW (ref 13.0–17.0)
MCH: 28.9 pg (ref 26.0–34.0)
MCHC: 31.7 g/dL (ref 30.0–36.0)
MCV: 91.3 fL (ref 80.0–100.0)
Platelets: 284 10*3/uL (ref 150–400)
RBC: 4.15 MIL/uL — ABNORMAL LOW (ref 4.22–5.81)
RDW: 12.9 % (ref 11.5–15.5)
WBC: 16.8 10*3/uL — ABNORMAL HIGH (ref 4.0–10.5)
nRBC: 0 % (ref 0.0–0.2)

## 2018-08-07 LAB — HIV ANTIBODY (ROUTINE TESTING W REFLEX): HIV Screen 4th Generation wRfx: NONREACTIVE

## 2018-08-07 MED ORDER — METOCLOPRAMIDE HCL 5 MG/ML IJ SOLN: 5.0000 mg | Freq: Three times a day (TID) | INTRAMUSCULAR | Status: DC | PRN

## 2018-08-07 MED ORDER — PANTOPRAZOLE SODIUM 40 MG IV SOLR
40.0000 mg | Freq: Every day | INTRAVENOUS | Status: DC
  Filled 2018-08-07: qty 40

## 2018-08-07 MED ORDER — DOCUSATE SODIUM 100 MG PO CAPS
100.0000 mg | ORAL_CAPSULE | Freq: Two times a day (BID) | ORAL | Status: DC
  Filled 2018-08-07: qty 1

## 2018-08-07 MED ORDER — ONDANSETRON 4 MG PO TBDP: 4.0000 mg | ORAL_TABLET | Freq: Four times a day (QID) | ORAL | Status: DC | PRN

## 2018-08-07 MED ORDER — ENOXAPARIN SODIUM 40 MG/0.4ML ~~LOC~~ SOLN: 40.0000 mg | SUBCUTANEOUS | Status: DC

## 2018-08-07 MED ORDER — DOCUSATE SODIUM 100 MG PO CAPS: 100.0000 mg | ORAL_CAPSULE | Freq: Two times a day (BID) | ORAL | 0 refills | Status: DC

## 2018-08-07 MED ORDER — METHOCARBAMOL 1000 MG/10ML IJ SOLN: 1000.0000 mg | Freq: Three times a day (TID) | INTRAVENOUS | Status: DC | PRN

## 2018-08-07 MED ORDER — HYDROMORPHONE HCL 1 MG/ML IJ SOLN: 0.5000 mg | INTRAMUSCULAR | Status: DC | PRN

## 2018-08-07 MED ORDER — METOCLOPRAMIDE HCL 5 MG PO TABS: 5.0000 mg | ORAL_TABLET | Freq: Three times a day (TID) | ORAL | Status: DC | PRN

## 2018-08-07 MED ORDER — HYDROMORPHONE HCL 1 MG/ML IJ SOLN: 1.0000 mg | Freq: Once | INTRAMUSCULAR | 0 refills | Status: AC

## 2018-08-07 MED ORDER — HYDRALAZINE HCL 20 MG/ML IJ SOLN: 10.0000 mg | INTRAMUSCULAR | Status: DC | PRN

## 2018-08-07 MED ORDER — CEFAZOLIN SODIUM-DEXTROSE 2-4 GM/100ML-% IV SOLN
2.0000 g | Freq: Four times a day (QID) | INTRAVENOUS | Status: DC
  Administered 2018-08-07: 2 g via INTRAVENOUS
  Filled 2018-08-07 (×2): qty 100

## 2018-08-07 MED ORDER — ONDANSETRON HCL 4 MG/2ML IJ SOLN: 4.0000 mg | Freq: Four times a day (QID) | INTRAMUSCULAR | Status: DC | PRN

## 2018-08-07 MED ORDER — ONDANSETRON HCL 4 MG PO TABS: 4.0000 mg | ORAL_TABLET | Freq: Four times a day (QID) | ORAL | 0 refills | Status: DC | PRN

## 2018-08-07 MED ORDER — OXYCODONE HCL 5 MG PO TABS: 5.0000 mg | ORAL_TABLET | ORAL | Status: DC | PRN

## 2018-08-07 MED ORDER — CEFAZOLIN SODIUM-DEXTROSE 1-4 GM/50ML-% IV SOLN: 1.0000 g | Freq: Three times a day (TID) | INTRAVENOUS | Status: DC

## 2018-08-07 MED ORDER — HYDROMORPHONE HCL 1 MG/ML IJ SOLN
1.0000 mg | INTRAMUSCULAR | Status: DC | PRN
  Administered 2018-08-07: 1 mg via INTRAVENOUS
  Filled 2018-08-07 (×2): qty 1

## 2018-08-07 MED ORDER — OXYCODONE HCL 5 MG PO TABS: 5.0000 mg | ORAL_TABLET | ORAL | 0 refills | Status: DC | PRN

## 2018-08-07 MED ORDER — HYDROMORPHONE HCL 1 MG/ML IJ SOLN: 1.0000 mg | INTRAMUSCULAR | 0 refills | Status: DC | PRN

## 2018-08-07 MED ORDER — CEFAZOLIN SODIUM-DEXTROSE 2-4 GM/100ML-% IV SOLN: 2.0000 g | Freq: Four times a day (QID) | INTRAVENOUS | Status: DC

## 2018-08-07 MED ORDER — PANTOPRAZOLE SODIUM 40 MG PO TBEC: 40.0000 mg | DELAYED_RELEASE_TABLET | Freq: Every day | ORAL | Status: DC

## 2018-08-07 MED ORDER — ONDANSETRON HCL 4 MG PO TABS: 4.0000 mg | ORAL_TABLET | Freq: Four times a day (QID) | ORAL | Status: DC | PRN

## 2018-08-07 MED ORDER — HYDROMORPHONE HCL 1 MG/ML IJ SOLN: 0.5000 mg | INTRAMUSCULAR | 0 refills | Status: DC | PRN

## 2018-08-07 MED ORDER — ONDANSETRON 4 MG PO TBDP: 4.0000 mg | ORAL_TABLET | Freq: Four times a day (QID) | ORAL | 0 refills | Status: DC | PRN
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
## 2018-08-07 NOTE — Progress Notes (Signed)
Orthopedic Tech Progress Note Patient Details:  Joshua Hansen [birth year] [id number] Applied with [name] Type of Ortho Device: CAM walker Splint Material: Fiberglass Ortho Device/Splint Location: LLE Ortho Device/Splint Interventions: Adjustment, Application, Ordered   Post Interventions Patient Tolerated: Well Instructions Provided: Care of device   Janit Pagan 08/07/2018, 1:44 AM

## 2018-08-07 NOTE — Anesthesia Postprocedure Evaluation (Signed)
Anesthesia Post Note  Patient: Adom A Requejo  Procedure(s) Performed: IRRIGATION AND DEBRIDEMENT EXTREMITY (Left ) INTRAMEDULLARY (IM) NAIL TIBIAL (Left )     Patient location during evaluation: PACU Anesthesia Type: General Level of consciousness: sedated, patient cooperative and oriented Pain management: pain level controlled Vital Signs Assessment: post-procedure vital signs reviewed and stable Respiratory status: spontaneous breathing, nonlabored ventilation and respiratory function stable Cardiovascular status: blood pressure returned to baseline and stable Postop Assessment: no apparent nausea or vomiting Anesthetic complications: no    Last Vitals:  Vitals:   08/06/18 2345 08/07/18 0000  BP: 126/73 138/75  Pulse: 94 (!) 9  Resp: 15 (!) 8  Temp:    SpO2: 96%     Last Pain:  Vitals:   08/06/18 2332  PainSc: Asleep                 Thurmond Hildebran,E. Littie Chiem
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
## 2018-08-07 NOTE — Progress Notes (Signed)
Ems given oral and written report on pt. Assisted with transfer from bed to stretcher and pt en route to [facility]. Family at bedside and made aware.

## 2018-08-08 ENCOUNTER — Encounter (HOSPITAL_COMMUNITY): Payer: Self-pay | Admitting: Orthopedic Surgery

## 2018-08-08 ENCOUNTER — Ambulatory Visit: Payer: Self-pay | Admitting: Medical

## 2018-08-08 DIAGNOSIS — Z0289 Encounter for other administrative examinations: Secondary | ICD-10-CM

## 2018-08-08 MED ORDER — SENNOSIDES-DOCUSATE SODIUM 8.6-50 MG PO TABS
2.00 | ORAL_TABLET | ORAL | Status: DC
Start: 2018-08-10 — End: 2018-08-08

## 2018-08-08 MED ORDER — OXYCODONE-ACETAMINOPHEN 5-325 MG PO TABS
1.00 | ORAL_TABLET | ORAL | Status: DC
Start: ? — End: 2018-08-08

## 2018-08-08 MED ORDER — INFLUENZA VAC SPLIT QUAD 0.5 ML IM SUSY
0.50 | PREFILLED_SYRINGE | INTRAMUSCULAR | Status: DC
Start: ? — End: 2018-08-08

## 2018-08-08 MED ORDER — TRAMADOL HCL 50 MG PO TABS
50.00 | ORAL_TABLET | ORAL | Status: DC
Start: ? — End: 2018-08-08

## 2018-08-08 MED ORDER — ACETAMINOPHEN 325 MG PO TABS
325.00 | ORAL_TABLET | ORAL | Status: DC
Start: 2018-08-11 — End: 2018-08-08

## 2018-08-08 MED ORDER — POLYETHYLENE GLYCOL 3350 17 G PO PACK
17.00 | PACK | ORAL | Status: DC
Start: 2018-08-11 — End: 2018-08-08

## 2018-08-08 MED ORDER — ASPIRIN 325 MG PO TABS
325.00 | ORAL_TABLET | ORAL | Status: DC
Start: 2018-08-08 — End: 2018-08-08

## 2018-08-08 MED ORDER — MELATONIN 3 MG PO TABS
3.00 | ORAL_TABLET | ORAL | Status: DC
Start: ? — End: 2018-08-08

## 2018-08-12 MED ORDER — GABAPENTIN 100 MG PO CAPS
100.00 | ORAL_CAPSULE | ORAL | Status: DC
Start: 2018-08-10 — End: 2018-08-12

## 2018-08-12 MED ORDER — ONDANSETRON 4 MG PO TBDP
4.00 | ORAL_TABLET | ORAL | Status: DC
Start: ? — End: 2018-08-12

## 2018-08-12 MED ORDER — AMLODIPINE BESYLATE 5 MG PO TABS
5.00 | ORAL_TABLET | ORAL | Status: DC
Start: 2018-08-11 — End: 2018-08-12

## 2018-08-12 MED ORDER — ENOXAPARIN SODIUM 60 MG/0.6ML ~~LOC~~ SOLN
.60 | SUBCUTANEOUS | Status: DC
Start: 2018-08-11 — End: 2018-08-12

## 2018-08-12 MED ORDER — LISINOPRIL 20 MG PO TABS
40.00 | ORAL_TABLET | ORAL | Status: DC
Start: 2018-08-11 — End: 2018-08-12

## 2018-08-16 ENCOUNTER — Encounter (HOSPITAL_COMMUNITY): Payer: Self-pay | Admitting: Emergency Medicine

## 2018-08-16 ENCOUNTER — Other Ambulatory Visit: Payer: Self-pay | Admitting: *Deleted

## 2018-08-16 ENCOUNTER — Ambulatory Visit (HOSPITAL_COMMUNITY)
Admission: EM | Admit: 2018-08-16 | Discharge: 2018-08-16 | Disposition: A | Discharge status: Home / Self Care | Payer: No Typology Code available for payment source | Attending: Family Medicine | Admitting: Family Medicine

## 2018-08-16 DIAGNOSIS — F4310 Post-traumatic stress disorder, unspecified: Secondary | ICD-10-CM | POA: Insufficient documentation

## 2018-08-16 DIAGNOSIS — S301XXA Contusion of abdominal wall, initial encounter: Secondary | ICD-10-CM | POA: Insufficient documentation

## 2018-08-16 MED ORDER — TRAMADOL HCL 50 MG PO TABS: 50.0000 mg | ORAL_TABLET | Freq: Four times a day (QID) | ORAL | 0 refills | Status: DC | PRN

## 2018-08-16 MED ORDER — TRAZODONE HCL 50 MG PO TABS: 50.0000 mg | ORAL_TABLET | Freq: Every evening | ORAL | 1 refills | Status: DC | PRN
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
## 2018-08-17 ENCOUNTER — Telehealth: Payer: Self-pay

## 2018-08-17 DIAGNOSIS — S82292B Other fracture of shaft of left tibia, initial encounter for open fracture type I or II: Secondary | ICD-10-CM
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
## 2018-08-17 NOTE — Telephone Encounter (Signed)
NP appt with Percell Miller scheduled.

## 2018-08-17 NOTE — Addendum Note (Signed)
Addended by: Anabel Halon on: 08/17/2018 07:09 PM   Modules accepted: Orders
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
## 2018-08-17 NOTE — Addendum Note (Signed)
Addended by: Kem Boroughs D on: 08/17/2018 12:59 PM   Modules accepted: Orders

## 2018-08-17 NOTE — Telephone Encounter (Signed)
I signed referral. Can you coordinate with [facility]. I signed the referral.

## 2018-08-23 ENCOUNTER — Ambulatory Visit (INDEPENDENT_AMBULATORY_CARE_PROVIDER_SITE_OTHER): Payer: No Typology Code available for payment source | Admitting: Orthopaedic Surgery

## 2018-08-23 ENCOUNTER — Encounter (INDEPENDENT_AMBULATORY_CARE_PROVIDER_SITE_OTHER): Payer: Self-pay

## 2018-08-24 ENCOUNTER — Telehealth: Payer: Self-pay | Admitting: *Deleted

## 2018-08-24 NOTE — Telephone Encounter (Signed)
Received Home Health Discharge/Transfer Summary from [facility]; forwarded to provider [facility] pt on 08/31/18]/SLS 01/21

## 2018-08-31 ENCOUNTER — Encounter: Payer: Self-pay | Admitting: Medical

## 2018-08-31 ENCOUNTER — Ambulatory Visit (INDEPENDENT_AMBULATORY_CARE_PROVIDER_SITE_OTHER): Payer: No Typology Code available for payment source | Admitting: Medical

## 2018-08-31 VITALS — BP 118/76 | HR 74 | Temp 98.1°F | Resp 16 | Ht 69.0 in | Wt 198.8 lb

## 2018-08-31 DIAGNOSIS — G4700 Insomnia, unspecified: Secondary | ICD-10-CM

## 2018-08-31 DIAGNOSIS — F329 Major depressive disorder, single episode, unspecified: Secondary | ICD-10-CM | POA: Diagnosis not present

## 2018-08-31 DIAGNOSIS — I10 Essential (primary) hypertension: Secondary | ICD-10-CM | POA: Diagnosis not present

## 2018-08-31 DIAGNOSIS — F419 Anxiety disorder, unspecified: Secondary | ICD-10-CM

## 2018-08-31 DIAGNOSIS — F32A Depression, unspecified: Secondary | ICD-10-CM

## 2018-08-31 DIAGNOSIS — B351 Tinea unguium: Secondary | ICD-10-CM

## 2018-08-31 MED ORDER — LISINOPRIL 40 MG PO TABS: 40.0000 mg | ORAL_TABLET | Freq: Every day | ORAL | 0 refills | Status: DC

## 2018-08-31 MED ORDER — LISINOPRIL 40 MG PO TABS: 40.0000 mg | ORAL_TABLET | Freq: Every day | ORAL | 3 refills | Status: DC

## 2018-08-31 MED ORDER — SERTRALINE HCL 25 MG PO TABS: 25.0000 mg | ORAL_TABLET | Freq: Every day | ORAL | 0 refills | Status: DC

## 2018-08-31 MED ORDER — AMLODIPINE BESYLATE 5 MG PO TABS: 5.0000 mg | ORAL_TABLET | Freq: Every day | ORAL | 0 refills | Status: DC

## 2018-08-31 MED ORDER — CLONAZEPAM 0.25 MG PO TBDP: ORAL_TABLET | ORAL | 0 refills | Status: DC
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
## 2018-08-31 NOTE — Patient Instructions (Addendum)
For your history of hypertension, I did refill your blood pressure medication today.  For recent depression, anxiety and insomnia, I want you to continue trazodone and will add low-dose sertraline for both anxiety and depression.  Also given prescription of clonazepam to use 1 tablet twice a day but only for severe anxiety or panic attacks.  The less you use clonazepam the the more effective it will be when needed.   If you develop thoughts of any harm to yourself or others and recommend ED evaluation at [facility].  Would recommend that you continue counseling.  You do have probable nail fungus present.  Treatments options discussed today.  We will get metabolic panel today and send sample of your nail for fungal studies.   When studies are back we will decide on prescribing Lamisil.  Follow-up in 2 weeks or as needed.

## 2018-09-01 LAB — COMPREHENSIVE METABOLIC PANEL
ALT: 14 U/L (ref 0–53)
AST: 10 U/L (ref 0–37)
Albumin: 4.5 g/dL (ref 3.5–5.2)
Alkaline Phosphatase: 112 U/L (ref 39–117)
BUN: 14 mg/dL (ref 6–23)
CALCIUM: 9.8 mg/dL (ref 8.4–10.5)
CO2: 26 mEq/L (ref 19–32)
Chloride: 103 mEq/L (ref 96–112)
Creatinine, Ser: 0.87 mg/dL (ref 0.40–1.50)
GFR: 98.58 mL/min (ref >60.00)
Glucose, Bld: 80 mg/dL (ref 70–99)
Potassium: 3.9 mEq/L (ref 3.5–5.1)
Sodium: 138 mEq/L (ref 135–145)
Total Bilirubin: 0.4 mg/dL (ref 0.2–1.2)
Total Protein: 7 g/dL (ref 6.0–8.3)

## 2018-09-02 LAB — FUNGAL STAIN
FUNGAL SMEAR:: NONE SEEN
MICRO NUMBER:: 121914
SPECIMEN QUALITY:: ADEQUATE

## 2018-09-16 ENCOUNTER — Ambulatory Visit (INDEPENDENT_AMBULATORY_CARE_PROVIDER_SITE_OTHER): Payer: No Typology Code available for payment source | Admitting: Medical

## 2018-09-16 ENCOUNTER — Encounter: Payer: Self-pay | Admitting: Medical

## 2018-09-16 VITALS — BP 109/61 | HR 71 | Temp 98.5°F | Resp 16 | Ht 69.0 in | Wt 193.4 lb

## 2018-09-16 DIAGNOSIS — L603 Nail dystrophy: Secondary | ICD-10-CM

## 2018-09-16 DIAGNOSIS — I10 Essential (primary) hypertension: Secondary | ICD-10-CM | POA: Diagnosis not present

## 2018-09-16 DIAGNOSIS — F419 Anxiety disorder, unspecified: Secondary | ICD-10-CM | POA: Diagnosis not present

## 2018-09-16 DIAGNOSIS — F329 Major depressive disorder, single episode, unspecified: Secondary | ICD-10-CM | POA: Diagnosis not present

## 2018-09-16 DIAGNOSIS — F32A Depression, unspecified: Secondary | ICD-10-CM

## 2018-09-16 DIAGNOSIS — B351 Tinea unguium: Secondary | ICD-10-CM

## 2018-09-16 MED ORDER — SERTRALINE HCL 50 MG PO TABS: 50.0000 mg | ORAL_TABLET | Freq: Every day | ORAL | 0 refills | Status: DC

## 2018-09-16 MED ORDER — CLONAZEPAM 0.5 MG PO TABS: 0.5000 mg | ORAL_TABLET | Freq: Two times a day (BID) | ORAL | 0 refills | Status: DC | PRN

## 2018-09-16 NOTE — Patient Instructions (Addendum)
You report feeling about the same level of anxiety depression as last time.  This is despite use of low-dose clonazepam and low-dose sertraline.  I want you to increase the clonazepam to 0.5 mg twice daily as needed.  Also increase sertraline to 50 mg daily.  I have to advise against using kava in addition to the above meds.  Please update me via my chart if doubling the dosing has helped his mood.  The effec of  clonazepam should be immediate but effect of sertraline might be delayed for up to 2 weeks.  Lower side bp  today. Recommend get bp cuff at [facility]. Check daily If less than 120/80 then just take amlodipine 2.5 mg. But continue the lisinopril  Follow-up in 2 to 3 weeks or as needed.(If you send me a my chart message at the 2-week mark I can let you know when your next appointment should be.)

## 2018-09-16 NOTE — Progress Notes (Signed)
Subjective:    Patient ID: Joshua Hansen, male    DOB: 1980-09-22, 38 y.o.   MRN: [id number]  HPI  Pt in for follow up.  Pt states he is still feeling anxious. He states klonopin helps a little. He only used briefly. He still has 4 tab left. He was given 8 tablets.   He did use kava which his wife got over the counter but it did not last long.  I also gave him sertraline low dose. He still feels depressed about the same as before.  Not sleeping well at night.  Pt is still meeting with counselor.  Hx of htn in past. He is on 2 meds. Recently his bp had been in lower side in 110/60 range. No dizziness on standing. Has no bp cuff. On 2 meds.   Review of Systems  Constitutional: Negative for chills, fatigue and fever.  Respiratory: Negative for cough, chest tightness, shortness of breath and wheezing.   Cardiovascular: Negative for chest pain and palpitations.  Gastrointestinal: Negative for abdominal pain, constipation and diarrhea.  Musculoskeletal: Negative for back pain.  Skin: Negative for rash.  Neurological: Negative for dizziness, syncope, numbness and headaches.  Hematological: Negative for adenopathy. Does not bruise/bleed easily.  Psychiatric/Behavioral: Positive for dysphoric mood and sleep disturbance. Negative for agitation, behavioral problems, hallucinations and suicidal ideas. The patient is nervous/anxious. The patient is not hyperactive.     Past Medical History:  Diagnosis Date  . Hypertension   . Rheumatic fever    as child     Social History   Socioeconomic History  . Marital status: Single    Spouse name: Not on file  . Number of children: Not on file  . Years of education: Not on file  . Highest education level: Not on file  Occupational History  . Not on file  Social Needs  . Financial resource strain: Not on file  . Food insecurity:    Worry: Not on file    Inability: Not on file  . Transportation needs:    Medical: Not on file   Non-medical: Not on file  Tobacco Use  . Smoking status: Never Smoker  . Smokeless tobacco: Never Used  Substance and Sexual Activity  . Alcohol use: Not Currently    Frequency: Never  . Drug use: Not on file  . Sexual activity: Not on file  Lifestyle  . Physical activity:    Days per week: Not on file    Minutes per session: Not on file  . Stress: Not on file  Relationships  . Social connections:    Talks on phone: Not on file    Gets together: Not on file    Attends religious service: Not on file    Active member of club or organization: Not on file    Attends meetings of clubs or organizations: Not on file    Relationship status: Not on file  . Intimate partner violence:    Fear of current or ex partner: Not on file    Emotionally abused: Not on file    Physically abused: Not on file    Forced sexual activity: Not on file  Other Topics Concern  . Not on file  Social History Narrative  . Not on file    Past Surgical History:  Procedure Laterality Date  . I&D EXTREMITY Left 08/06/2018   Procedure: IRRIGATION AND DEBRIDEMENT EXTREMITY;  Surgeon: Nicholes Stairs, MD;  Location: [place];  Service: Orthopedics;  Laterality: Left;  . TIBIA IM NAIL INSERTION Left 08/06/2018   Procedure: INTRAMEDULLARY (IM) NAIL TIBIAL;  Surgeon: Nicholes Stairs, MD;  Location: [place];  Service: Orthopedics;  Laterality: Left;    No family history on file.  No Known Allergies  Current Outpatient Medications on File Prior to Visit  Medication Sig Dispense Refill  . amLODipine (NORVASC) 5 MG tablet Take 1 tablet (5 mg total) by mouth daily. 90 tablet 0  . clonazePAM (KLONOPIN) 0.25 MG disintegrating tablet 1 tab po bid as needed anxiety/panic attack 8 tablet 0  . lisinopril (PRINIVIL,ZESTRIL) 40 MG tablet Take 1 tablet (40 mg total) by mouth daily. 90 tablet 3  . metoCLOPramide (REGLAN) 5 MG tablet Take 1-2 tablets (5-10 mg total) by mouth every 8 (eight) hours as needed for nausea (if  ondansetron (ZOFRAN) ineffective.).    Marland Kitchen sertraline (ZOLOFT) 25 MG tablet Take 1 tablet (25 mg total) by mouth daily. 30 tablet 0  . traMADol (ULTRAM) 50 MG tablet Take 1 tablet (50 mg total) by mouth every 6 (six) hours as needed. 15 tablet 0  . traZODone (DESYREL) 50 MG tablet Take 1 tablet (50 mg total) by mouth at bedtime as needed for sleep. 30 tablet 1   No current facility-administered medications on file prior to visit.     BP 109/61   Pulse 71   Temp 98.5 F (36.9 C) (Oral)   Resp 16   Ht 5\' 9"  (1.753 m)   Wt 193 lb 6.4 oz (87.7 kg)   SpO2 100%   BMI 28.56 kg/m       Objective:   Physical Exam  General Mental Status- Alert. General Appearance- Not in acute distress.   Skin General: Color- Normal Color. Moisture- Normal Moisture.  Neck Carotid Arteries- Normal color. Moisture- Normal Moisture. No carotid bruits. No JVD.  Chest and Lung Exam Auscultation: Breath Sounds:-Normal.  Cardiovascular Auscultation:Rythm- Regular. Murmurs & Other Heart Sounds:Auscultation of the heart reveals- No Murmurs.  Abdomen Inspection:-Inspeection Normal. Palpation/Percussion:Note:No mass. Palpation and Percussion of the abdomen reveal- Non Tender, Non Distended + BS, no rebound or guarding.   Neurologic Cranial Nerve exam:- CN III-XII intact(No nystagmus), symmetric smile. Strength:- 5/5 equal and symmetric strength both upper and lower extremities.     Assessment & Plan:  You report feeling about the same level of anxiety depression as last time.  This is despite use of low-dose clonazepam and low-dose sertraline.  I want you to increase the clonazepam to 0.5 mg twice daily as needed.  Also increase sertraline to 50 mg daily.  I have to advise against using kava in addition to the above meds.  Please update me via my chart if doubling the dosing has helped his mood.  The effect of  clonazepam should be immediate but effect of sertraline might be delayed for up to 2  weeks.  Lower side bp  today. Recommend get bp cuff at [facility]. Check daily If less than 120/80 then just take amlodipine 2.5 mg. But continue lisinopril same dose.   Follow-up in 2 to 3 weeks or as needed.(If you send me a my chart message at the 2-week mark I can let you know when your next appointment should be.)  [name], PA-C

## 2018-09-20 ENCOUNTER — Telehealth: Payer: Self-pay | Admitting: Medical

## 2018-09-20 MED ORDER — TERBINAFINE HCL 250 MG PO TABS: 250.0000 mg | ORAL_TABLET | Freq: Every day | ORAL | 2 refills | Status: DC

## 2018-09-20 NOTE — Telephone Encounter (Signed)
Rx lamisil sent to pt pharmacy.

## 2018-09-21 ENCOUNTER — Telehealth: Payer: Self-pay | Admitting: Medical

## 2018-09-21 DIAGNOSIS — Z5181 Encounter for therapeutic drug level monitoring: Secondary | ICD-10-CM

## 2018-09-21 DIAGNOSIS — B351 Tinea unguium: Secondary | ICD-10-CM

## 2018-09-21 NOTE — Telephone Encounter (Signed)
Future lab order placed.  

## 2018-10-03 LAB — CULT, FUNGUS, SKIN,HAIR,NAIL W/KOH
MICRO NUMBER:: 197318
SPECIMEN QUALITY:: ADEQUATE

## 2018-10-19 ENCOUNTER — Encounter: Payer: Self-pay | Admitting: Medical

## 2018-10-19 ENCOUNTER — Telehealth: Payer: Self-pay | Admitting: Medical

## 2018-10-19 MED ORDER — SERTRALINE HCL 50 MG PO TABS: 50.0000 mg | ORAL_TABLET | Freq: Every day | ORAL | 3 refills | Status: DC

## 2018-10-19 NOTE — Telephone Encounter (Signed)
Rx sertrallne sent to pt pharmacy.

## 2018-11-29 ENCOUNTER — Ambulatory Visit (INDEPENDENT_AMBULATORY_CARE_PROVIDER_SITE_OTHER): Payer: No Typology Code available for payment source | Admitting: Medical

## 2018-11-29 ENCOUNTER — Encounter: Payer: Self-pay | Admitting: Medical

## 2018-11-29 ENCOUNTER — Other Ambulatory Visit: Payer: Self-pay

## 2018-11-29 DIAGNOSIS — F329 Major depressive disorder, single episode, unspecified: Secondary | ICD-10-CM | POA: Diagnosis not present

## 2018-11-29 DIAGNOSIS — F32A Depression, unspecified: Secondary | ICD-10-CM

## 2018-11-29 DIAGNOSIS — F419 Anxiety disorder, unspecified: Secondary | ICD-10-CM

## 2018-11-29 MED ORDER — CLONAZEPAM 0.5 MG PO TABS: 0.5000 mg | ORAL_TABLET | Freq: Two times a day (BID) | ORAL | 0 refills | Status: DC | PRN
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
## 2018-12-09 ENCOUNTER — Other Ambulatory Visit: Payer: Self-pay | Admitting: Medical

## 2018-12-29 DIAGNOSIS — M51369 Other intervertebral disc degeneration, lumbar region without mention of lumbar back pain or lower extremity pain: Secondary | ICD-10-CM

## 2018-12-29 DIAGNOSIS — M5136 Other intervertebral disc degeneration, lumbar region: Secondary | ICD-10-CM | POA: Insufficient documentation

## 2018-12-29 HISTORY — DX: Other intervertebral disc degeneration, lumbar region without mention of lumbar back pain or lower extremity pain: M51.369

## 2018-12-29 HISTORY — DX: Other intervertebral disc degeneration, lumbar region: M51.36

## 2019-01-02 ENCOUNTER — Encounter: Payer: Self-pay | Admitting: Physical Therapy

## 2019-01-02 ENCOUNTER — Other Ambulatory Visit: Payer: Self-pay

## 2019-01-02 ENCOUNTER — Ambulatory Visit: Payer: No Typology Code available for payment source | Attending: Orthopedic Surgery | Admitting: Physical Therapy

## 2019-01-02 DIAGNOSIS — R262 Difficulty in walking, not elsewhere classified: Secondary | ICD-10-CM | POA: Insufficient documentation

## 2019-01-02 DIAGNOSIS — M545 Low back pain, unspecified: Secondary | ICD-10-CM

## 2019-01-02 DIAGNOSIS — M25512 Pain in left shoulder: Secondary | ICD-10-CM | POA: Diagnosis present

## 2019-01-02 DIAGNOSIS — M25672 Stiffness of left ankle, not elsewhere classified: Secondary | ICD-10-CM | POA: Diagnosis present

## 2019-01-02 DIAGNOSIS — M25612 Stiffness of left shoulder, not elsewhere classified: Secondary | ICD-10-CM | POA: Insufficient documentation

## 2019-01-02 DIAGNOSIS — M25572 Pain in left ankle and joints of left foot: Secondary | ICD-10-CM | POA: Diagnosis present
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
## 2019-01-04 ENCOUNTER — Ambulatory Visit: Payer: No Typology Code available for payment source | Admitting: Physical Therapy

## 2019-01-04 ENCOUNTER — Encounter: Payer: Self-pay | Admitting: Physical Therapy

## 2019-01-04 ENCOUNTER — Other Ambulatory Visit: Payer: Self-pay

## 2019-01-04 DIAGNOSIS — M25612 Stiffness of left shoulder, not elsewhere classified: Secondary | ICD-10-CM

## 2019-01-04 DIAGNOSIS — M545 Low back pain, unspecified: Secondary | ICD-10-CM

## 2019-01-04 DIAGNOSIS — M25572 Pain in left ankle and joints of left foot: Secondary | ICD-10-CM

## 2019-01-04 DIAGNOSIS — M25672 Stiffness of left ankle, not elsewhere classified: Secondary | ICD-10-CM

## 2019-01-04 DIAGNOSIS — M25512 Pain in left shoulder: Secondary | ICD-10-CM

## 2019-01-04 DIAGNOSIS — R262 Difficulty in walking, not elsewhere classified: Secondary | ICD-10-CM

## 2019-01-04 NOTE — Therapy (Signed)
[address] [place], [place], [address] Phone: [phone]   Fax:  [phone]  Physical Therapy Treatment  Patient Details  Name: Joshua Hansen MRN: [id number] Date of Birth: 12-Apr-1981 Referring Provider (PT): Victorino December   Encounter Date: 01/04/2019  PT End of Session - 01/04/19 1515    Visit Number  2    Date for PT Re-Evaluation  03/04/19    PT Start Time  1430    PT Stop Time  1530    PT Time Calculation (min)  60 min    Activity Tolerance  Patient tolerated treatment well    Behavior During Therapy  [facility] for tasks assessed/performed       Past Medical History:  Diagnosis Date  . Hypertension   . Rheumatic fever    as child    Past Surgical History:  Procedure Laterality Date  . I&D EXTREMITY Left 08/06/2018   Procedure: IRRIGATION AND DEBRIDEMENT EXTREMITY;  Surgeon: Nicholes Stairs, MD;  Location: [facility];  Service: Orthopedics;  Laterality: Left;  . TIBIA IM NAIL INSERTION Left 08/06/2018   Procedure: INTRAMEDULLARY (IM) NAIL TIBIAL;  Surgeon: Nicholes Stairs, MD;  Location: [facility];  Service: Orthopedics;  Laterality: Left;    There were no vitals filed for this visit.  Subjective Assessment - 01/04/19 1431    Subjective  "My back hurt a little bit"    Currently in Pain?  Yes    Pain Score  5     Pain Location  Back    Pain Orientation  Lower                       [facility] Adult PT Treatment/Exercise - 01/04/19 0001      Exercises   Exercises  Lumbar;Knee/Hip      Lumbar Exercises: Aerobic   Nustep  L4 x 7 min       Lumbar Exercises: Standing   Row  Theraband;20 reps;Both;Strengthening    Theraband Level (Row)  Level 3 (Green)    Shoulder Extension  Theraband;20 reps;Both;Strengthening    Theraband Level (Shoulder Extension)  Level 3 (Green)      Knee/Hip Exercises: Machines for Strengthening   Cybex Knee Flexion  25lb 2x10, LLE 15lb 2x5     Cybex Leg Press  20lb 2x10, LLE no weight 2x10       Knee/Hip Exercises: Seated   Long Arc Quad  Left;2 sets;10 reps      Knee/Hip Exercises: Supine   Short Arc Quad Sets  10 reps;Left;2 sets    Short Arc Quad Sets Limitations  2    Straight Leg Raises  10 reps;Left;Strengthening;2 sets      Modalities   Modalities  Electrical Stimulation;Moist Heat      Moist Heat Therapy   Number Minutes Moist Heat  15 Minutes    Moist Heat Location  Lumbar Spine      Electrical Stimulation   Electrical Stimulation Location  lumbar     Electrical Stimulation Action  IFC    Electrical Stimulation Parameters  supine    Electrical Stimulation Goals  Pain               PT Short Term Goals - 01/02/19 1409      PT SHORT TERM GOAL #1   Title  independent with initial HEP    Time  2    Period  Weeks  Status  New        PT Long Term Goals - 01/02/19 1409      PT LONG TERM GOAL #1   Title  understand posture and body mechanics    Time  8    Period  Weeks    Status  New      PT LONG TERM GOAL #2   Title  decrease LBP 50%    Time  8    Period  Weeks    Status  New      PT LONG TERM GOAL #3   Title  walk without deviation without device    Time  8    Period  Weeks    Status  New      PT LONG TERM GOAL #4   Title  increase left shoulder ER/IR to 45 degrees    Time  8    Period  Weeks    Status  New      PT LONG TERM GOAL #5   Title  increase left ankle DF to 10 degrees    Time  8    Period  Weeks    Status  New            Plan - 01/04/19 1515    Clinical Impression Statement  Pt tolerated an initial progression to TE well. Pt was unable to keep LLE in contact with pad for the full ROM of seated leg extensions. He did well with supine exercises. Weakness noted with SLR with some extensor lag.     Rehab Potential  Good    PT Frequency  3x / week    PT Duration  8 weeks    PT Treatment/Interventions  ADLs/Self Care Home Management;Cryotherapy;Electrical  Stimulation;Iontophoresis 4mg /ml Dexamethasone;Moist Heat;Ultrasound;Gait training;Stair training;Functional mobility training;Therapeutic activities;Neuromuscular re-education;Balance training;Therapeutic exercise;Patient/family education;Manual techniques    PT Next Visit Plan  slowly start him moving, focus on back and his gait, then can work on the left shoulder        Patient will benefit from skilled therapeutic intervention in order to improve the following deficits and impairments:     Visit Diagnosis: Acute bilateral low back pain without sciatica  Acute pain of left shoulder  Stiffness of left shoulder, not elsewhere classified  Difficulty in walking, not elsewhere classified  Pain in left ankle and joints of left foot  Stiffness of left ankle, not elsewhere classified     Problem List Patient Active Problem List   Diagnosis Date Noted  . Open fracture of left tibia 08/06/2018    Scot Jun, PTA 01/04/2019, 3:24 PM  [address] [place], [place], [address] Phone: [phone]   Fax:  [phone]  Name: Joshua Hansen MRN: [id number] Date of Birth: 12/12/1980

## 2019-01-09 ENCOUNTER — Encounter: Payer: Self-pay | Admitting: Physical Therapy

## 2019-01-09 ENCOUNTER — Ambulatory Visit: Payer: No Typology Code available for payment source | Admitting: Physical Therapy

## 2019-01-09 ENCOUNTER — Other Ambulatory Visit: Payer: Self-pay

## 2019-01-09 DIAGNOSIS — M545 Low back pain, unspecified: Secondary | ICD-10-CM

## 2019-01-09 DIAGNOSIS — M25612 Stiffness of left shoulder, not elsewhere classified: Secondary | ICD-10-CM

## 2019-01-09 DIAGNOSIS — R262 Difficulty in walking, not elsewhere classified: Secondary | ICD-10-CM

## 2019-01-09 DIAGNOSIS — M25512 Pain in left shoulder: Secondary | ICD-10-CM

## 2019-01-09 NOTE — Therapy (Signed)
[address] [place], [place], [address] Phone: [phone]   Fax:  [phone]  Physical Therapy Treatment  Patient Details  Name: Joshua Hansen MRN: [id number] Date of Birth: 23-Dec-1980 Referring Provider (PT): Victorino December   Encounter Date: 01/09/2019  PT End of Session - 01/09/19 1504    Visit Number  3    Date for PT Re-Evaluation  03/04/19    PT Start Time  1400    PT Stop Time  1507    PT Time Calculation (min)  67 min       Past Medical History:  Diagnosis Date  . Hypertension   . Rheumatic fever    as child    Past Surgical History:  Procedure Laterality Date  . I&D EXTREMITY Left 08/06/2018   Procedure: IRRIGATION AND DEBRIDEMENT EXTREMITY;  Surgeon: Nicholes Stairs, MD;  Location: [facility];  Service: Orthopedics;  Laterality: Left;  . TIBIA IM NAIL INSERTION Left 08/06/2018   Procedure: INTRAMEDULLARY (IM) NAIL TIBIAL;  Surgeon: Nicholes Stairs, MD;  Location: [facility];  Service: Orthopedics;  Laterality: Left;    There were no vitals filed for this visit.  Subjective Assessment - 01/09/19 1400    Subjective  Pt reports that's he is feeling fine.    Currently in Pain?  No/denies                       [facility] Adult PT Treatment/Exercise - 01/09/19 0001      Lumbar Exercises: Aerobic   Nustep  L4 x 7 min       Lumbar Exercises: Standing   Shoulder Extension  Theraband;Both;Strengthening;15 reps   x2    Theraband Level (Shoulder Extension)  Level 3 (Green)    Other Standing Lumbar Exercises  LLE TKE ball vs wall 2x10     Other Standing Lumbar Exercises  Rows and lats 25lb 2x10       Lumbar Exercises: Seated   Sit to Stand  20 reps   blue chair hand on knees      Knee/Hip Exercises: Machines for Strengthening   Cybex Leg Press  40lb 2x10, LLE no weight 2x10       Knee/Hip Exercises: Standing   Forward Step Up  Right;2 sets;10 reps;Hand Hold:  0;Step Height: 6"    Walking with Sports Cord  30lb 4 way x3 each       Knee/Hip Exercises: Seated   Long Arc Quad  Left;2 sets;15 reps    Long Arc Quad Weight  2 lbs.    Hamstring Curl  Left;2 sets;15 reps    Hamstring Limitations  green Tband       Modalities   Modalities  Electrical Stimulation;Moist Heat      Moist Heat Therapy   Number Minutes Moist Heat  15 Minutes    Moist Heat Location  Lumbar Spine      Electrical Stimulation   Electrical Stimulation Location  lumbar     Electrical Stimulation Action  IFC    Electrical Stimulation Parameters  supine    Electrical Stimulation Goals  Pain               PT Short Term Goals - 01/09/19 1518      PT SHORT TERM GOAL #1   Title  independent with initial HEP    Status  Achieved        PT Long Term Goals -  01/09/19 1518      PT LONG TERM GOAL #1   Title  understand posture and body mechanics    Status  On-going      PT LONG TERM GOAL #2   Title  decrease LBP 50%    Status  Partially Met      PT LONG TERM GOAL #4   Title  increase left shoulder ER/IR to 45 degrees    Status  On-going            Plan - 01/09/19 1513    Clinical Impression Statement  LLE weakness exposed with LAQ and HS curls. Some assist also needed with SL on leg press. He had good stability with resisted gait but compensates due to the lack of L quad strength. Some postural compensation noted with seated rows and lats. Constant cues throughout session to put some weight through LLE.     Stability/Clinical Decision Making  Evolving/Moderate complexity    Rehab Potential  Good    PT Frequency  3x / week    PT Duration  8 weeks    PT Treatment/Interventions  ADLs/Self Care Home Management;Cryotherapy;Electrical Stimulation;Iontophoresis 79m/ml Dexamethasone;Moist Heat;Ultrasound;Gait training;Stair training;Functional mobility training;Therapeutic activities;Neuromuscular re-education;Balance training;Therapeutic exercise;Patient/family  education;Manual techniques    PT Next Visit Plan  slowly start him moving, focus on back and his gait, then can work on the left shoulder        Patient will benefit from skilled therapeutic intervention in order to improve the following deficits and impairments:  Abnormal gait, Decreased balance, Decreased endurance, Decreased mobility, Difficulty walking, Decreased range of motion, Decreased activity tolerance, Decreased strength, Impaired flexibility, Pain, Impaired UE functional use, Improper body mechanics, Increased muscle spasms  Visit Diagnosis: Acute pain of left shoulder  Acute bilateral low back pain without sciatica  Stiffness of left shoulder, not elsewhere classified  Difficulty in walking, not elsewhere classified     Problem List Patient Active Problem List   Diagnosis Date Noted  . Open fracture of left tibia 08/06/2018    RScot Jun PTA 01/09/2019, 3:18 PM  [address] [address]Phone: [phone]  Fax:  [phone] Name: Joshua KubitzMRN: [id number] of Birth: 1April 30, 1982

## 2019-01-12 ENCOUNTER — Ambulatory Visit: Payer: No Typology Code available for payment source | Admitting: Physical Therapy

## 2019-01-17 ENCOUNTER — Other Ambulatory Visit: Payer: Self-pay

## 2019-01-17 ENCOUNTER — Ambulatory Visit: Payer: No Typology Code available for payment source | Admitting: Physical Therapy

## 2019-01-17 ENCOUNTER — Encounter: Payer: Self-pay | Admitting: Physical Therapy

## 2019-01-17 DIAGNOSIS — M25512 Pain in left shoulder: Secondary | ICD-10-CM

## 2019-01-17 DIAGNOSIS — M545 Low back pain, unspecified: Secondary | ICD-10-CM

## 2019-01-17 DIAGNOSIS — M25612 Stiffness of left shoulder, not elsewhere classified: Secondary | ICD-10-CM

## 2019-01-17 NOTE — Therapy (Signed)
[address] [place], [place], [address] Phone: [phone]   Fax:  [phone]  Physical Therapy Treatment  Patient Details  Name: Joshua Hansen MRN: [id number] Date of Birth: 11/28/1980 Referring Provider (PT): Victorino December   Encounter Date: 01/17/2019  PT End of Session - 01/17/19 1451    Visit Number  4    Date for PT Re-Evaluation  03/04/19    PT Start Time  1406    PT Stop Time  1506    PT Time Calculation (min)  60 min    Activity Tolerance  Patient tolerated treatment well    Behavior During Therapy  [facility] for tasks assessed/performed       Past Medical History:  Diagnosis Date  . Hypertension   . Rheumatic fever    as child    Past Surgical History:  Procedure Laterality Date  . I&D EXTREMITY Left 08/06/2018   Procedure: IRRIGATION AND DEBRIDEMENT EXTREMITY;  Surgeon: Nicholes Stairs, MD;  Location: [place];  Service: Orthopedics;  Laterality: Left;  . TIBIA IM NAIL INSERTION Left 08/06/2018   Procedure: INTRAMEDULLARY (IM) NAIL TIBIAL;  Surgeon: Nicholes Stairs, MD;  Location: [place];  Service: Orthopedics;  Laterality: Left;    There were no vitals filed for this visit.  Subjective Assessment - 01/17/19 1414    Subjective  Pt reports that his back hurts little bit but not a lot    Currently in Pain?  Yes    Pain Score  4     Pain Location  Back         [facility] PT Assessment - 01/17/19 0001      AROM   Overall AROM Comments  Lumbar ROM decreased 50% for ext all other motions WFL. Some LBP with flexion     Left Shoulder Flexion  154 Degrees    Left Shoulder ABduction  124 Degrees    Left Shoulder Internal Rotation  49 Degrees    Left Shoulder External Rotation  65 Degrees                   [facility] Adult PT Treatment/Exercise - 01/17/19 0001      Lumbar Exercises: Aerobic   Elliptical  I7 R3 1.5 min each    Nustep  L5 x 6 min       Lumbar  Exercises: Standing   Other Standing Lumbar Exercises  Rows and lats 25lb 2x15       Knee/Hip Exercises: Machines for Strengthening   Cybex Leg Press  50lb 2x15, LLE no weight 2x10, 20lb heel raises 2x10       Knee/Hip Exercises: Standing   Walking with Sports Cord  30lb 4 way x3 each                PT Short Term Goals - 01/09/19 1518      PT SHORT TERM GOAL #1   Title  independent with initial HEP    Status  Achieved        PT Long Term Goals - 01/09/19 1518      PT LONG TERM GOAL #1   Title  understand posture and body mechanics    Status  On-going      PT LONG TERM GOAL #2   Title  decrease LBP 50%    Status  Partially Met      PT LONG TERM GOAL #4   Title  increase  left shoulder ER/IR to 45 degrees    Status  On-going            Plan - 01/17/19 1452    Clinical Impression Statement  Pt is progressing towards goals, he has increased his lumbar and L shoulder AROM. He did well overall today's. Tactile cues for posture needed with seated rows. Cues to maintain normal gait with resisted gait.    Rehab Potential  Good    PT Frequency  3x / week    PT Duration  8 weeks    PT Treatment/Interventions  ADLs/Self Care Home Management;Cryotherapy;Electrical Stimulation;Iontophoresis 64m/ml Dexamethasone;Moist Heat;Ultrasound;Gait training;Stair training;Functional mobility training;Therapeutic activities;Neuromuscular re-education;Balance training;Therapeutic exercise;Patient/family education;Manual techniques       Patient will benefit from skilled therapeutic intervention in order to improve the following deficits and impairments:  Abnormal gait, Decreased balance, Decreased endurance, Decreased mobility, Difficulty walking, Decreased range of motion, Decreased activity tolerance, Decreased strength, Impaired flexibility, Pain, Impaired UE functional use, Improper body mechanics, Increased muscle spasms  Visit Diagnosis: 1. Acute pain of left shoulder   2.  Acute bilateral low back pain without sciatica   3. Stiffness of left shoulder, not elsewhere classified        Problem List Patient Active Problem List   Diagnosis Date Noted  . Open fracture of left tibia 08/06/2018    RScot Jun PTA 01/17/2019, 2:59 PM  [address] [address]Phone: [phone]  Fax:  [phone] Name: Joshua GearheartMRN: [id number] of Birth: 1Mar 07, 1982

## 2019-01-19 ENCOUNTER — Other Ambulatory Visit: Payer: Self-pay

## 2019-01-19 ENCOUNTER — Ambulatory Visit: Payer: No Typology Code available for payment source | Admitting: Physical Therapy

## 2019-01-19 ENCOUNTER — Encounter: Payer: Self-pay | Admitting: Physical Therapy

## 2019-01-19 DIAGNOSIS — M545 Low back pain, unspecified: Secondary | ICD-10-CM

## 2019-01-19 DIAGNOSIS — M25672 Stiffness of left ankle, not elsewhere classified: Secondary | ICD-10-CM

## 2019-01-19 DIAGNOSIS — M25572 Pain in left ankle and joints of left foot: Secondary | ICD-10-CM

## 2019-01-19 DIAGNOSIS — M25612 Stiffness of left shoulder, not elsewhere classified: Secondary | ICD-10-CM

## 2019-01-19 DIAGNOSIS — R262 Difficulty in walking, not elsewhere classified: Secondary | ICD-10-CM

## 2019-01-19 DIAGNOSIS — M25512 Pain in left shoulder: Secondary | ICD-10-CM

## 2019-01-19 NOTE — Therapy (Signed)
[address] [place], [place], [address] Phone: [phone]   Fax:  [phone]  Physical Therapy Treatment  Patient Details  Name: Joshua Hansen MRN: [id number] Date of Birth: 1981/07/28 Referring Provider (PT): Joshua Hansen   Encounter Date: 01/19/2019  PT End of Session - 01/19/19 1639    Visit Number  5    Date for PT Re-Evaluation  03/04/19    PT Start Time  1550    PT Stop Time  1655    PT Time Calculation (min)  65 min    Activity Tolerance  Patient tolerated treatment well    Behavior During Therapy  [facility] for tasks assessed/performed       Past Medical History:  Diagnosis Date  . Hypertension   . Rheumatic fever    as child    Past Surgical History:  Procedure Laterality Date  . I&D EXTREMITY Left 08/06/2018   Procedure: IRRIGATION AND DEBRIDEMENT EXTREMITY;  Surgeon: Joshua Hansen;  Location: [place];  Service: Orthopedics;  Laterality: Left;  . TIBIA IM NAIL INSERTION Left 08/06/2018   Procedure: INTRAMEDULLARY (IM) NAIL TIBIAL;  Surgeon: Joshua Hansen;  Location: [place];  Service: Orthopedics;  Laterality: Left;    There were no vitals filed for this visit.  Subjective Assessment - 01/19/19 1553    Subjective  "Good"    Patient Stated Goals  walk better, have no pain    Currently in Pain?  No/denies                       [facility] Adult PT Treatment/Exercise - 01/19/19 0001      Ambulation/Gait   Stairs  Yes    Stairs Assistance  6: Modified independent (Device/Increase time)   SBA at times    Stair Management Technique  No rails;Alternating pattern    Number of Stairs  48    Height of Stairs  6    Gait Comments  LLE eccentric load weakness.       Lumbar Exercises: Aerobic   Elliptical  I10 R5 2 min each    Nustep  L5 x 6 min       Lumbar Exercises: Machines for Strengthening   Other Lumbar Machine Exercise  Rows & lats 35lb  2x10     Other Lumbar Machine Exercise  chest press 15lb 2x10      Knee/Hip Exercises: Machines for Strengthening   Cybex Knee Flexion  LLE 15lb 2x10    Cybex Leg Press  50lb 2x15, LLE 30 2x10, 50lb heel raises 2x10       Knee/Hip Exercises: Standing   Walking with Sports Cord  40lb 4 way x3 each       Knee/Hip Exercises: Seated   Long Arc Quad  Left;2 sets;10 reps    Long Arc Quad Weight  3 lbs.      Modalities   Modalities  Electrical Stimulation;Moist Heat      Moist Heat Therapy   Number Minutes Moist Heat  15 Minutes    Moist Heat Location  Lumbar Spine      Electrical Stimulation   Electrical Stimulation Location  lumbar     Electrical Stimulation Action  IFC    Electrical Stimulation Parameters  supine    Electrical Stimulation Goals  Pain               PT Short Term Goals - 01/09/19  [place] #1   Title  independent with initial HEP    Status  Achieved        PT Long Term Goals - 01/09/19 1518      PT LONG TERM GOAL #1   Title  understand posture and body mechanics    Status  On-going      PT LONG TERM GOAL #2   Title  decrease LBP 50%    Status  Partially Met      PT LONG TERM GOAL #4   Title  increase left shoulder ER/IR to 45 degrees    Status  On-going            Plan - 01/19/19 1640    Clinical Impression Statement  Overall pt is doing well. L quad remains very weak. LLE eccentric load weakness when descending stairs. He also has some compensation when ascending stairs. He was not able to complete a SL extension on machine at lowest weight. Attempted to a SL eccentric with low weight but it cause too much L knee pain. Good stability with resisted gait. Attempted controlled descents but cause too much pain.    Stability/Clinical Decision Making  Evolving/Moderate complexity    Rehab Potential  Good    PT Frequency  3x / week    PT Treatment/Interventions  ADLs/Self Care Home Management;Cryotherapy;Electrical  Stimulation;Iontophoresis 69m/ml Dexamethasone;Moist Heat;Ultrasound;Gait training;Stair training;Functional mobility training;Therapeutic activities;Neuromuscular re-education;Balance training;Therapeutic exercise;Patient/family education;Manual techniques    PT Next Visit Plan  slowly start him moving, focus on back and his gait, then can work on the left shoulder        Patient will benefit from skilled therapeutic intervention in order to improve the following deficits and impairments:  Abnormal gait, Decreased balance, Decreased endurance, Decreased mobility, Difficulty walking, Decreased range of motion, Decreased activity tolerance, Decreased strength, Impaired flexibility, Pain, Impaired UE functional use, Improper body mechanics, Increased muscle spasms  Visit Diagnosis: 1. Stiffness of left shoulder, not elsewhere classified   2. Pain in left ankle and joints of left foot   3. Stiffness of left ankle, not elsewhere classified   4. Difficulty in walking, not elsewhere classified   5. Acute bilateral low back pain without sciatica   6. Acute pain of left shoulder        Problem List Patient Active Problem List   Diagnosis Date Noted  . Open fracture of left tibia 08/06/2018    RScot Jun PTA 01/19/2019, 4:43 PM  [address] [address]Phone: [phone]  Fax:  [phone] Name: Joshua PindellMRN: [id number] of Birth: 123-Dec-1982

## 2019-01-23 ENCOUNTER — Ambulatory Visit: Payer: No Typology Code available for payment source | Admitting: Physical Therapy

## 2019-01-25 ENCOUNTER — Ambulatory Visit: Payer: No Typology Code available for payment source | Admitting: Physical Therapy

## 2019-01-26 ENCOUNTER — Ambulatory Visit: Payer: No Typology Code available for payment source | Admitting: Physical Therapy

## 2019-01-31 ENCOUNTER — Encounter: Payer: Self-pay | Admitting: Physical Therapy

## 2019-01-31 ENCOUNTER — Ambulatory Visit: Payer: No Typology Code available for payment source | Admitting: Physical Therapy

## 2019-01-31 ENCOUNTER — Other Ambulatory Visit: Payer: Self-pay

## 2019-01-31 DIAGNOSIS — M545 Low back pain, unspecified: Secondary | ICD-10-CM

## 2019-01-31 DIAGNOSIS — M25612 Stiffness of left shoulder, not elsewhere classified: Secondary | ICD-10-CM

## 2019-01-31 DIAGNOSIS — M25572 Pain in left ankle and joints of left foot: Secondary | ICD-10-CM

## 2019-01-31 DIAGNOSIS — R262 Difficulty in walking, not elsewhere classified: Secondary | ICD-10-CM

## 2019-01-31 DIAGNOSIS — M25672 Stiffness of left ankle, not elsewhere classified: Secondary | ICD-10-CM

## 2019-01-31 DIAGNOSIS — M25512 Pain in left shoulder: Secondary | ICD-10-CM

## 2019-01-31 NOTE — Therapy (Signed)
[facility] [address] [place], [place], [address] Phone: [phone]   Fax:  [phone]  Physical Therapy Treatment  Patient Details  Name: Joshua Hansen MRN: [id number] Date of Birth: 08/10/1980 Referring Provider (PT): Jason Rogers   Encounter Date: 01/31/2019  PT End of Session - 01/31/19 1147    Visit Number  6    Date for PT Re-Evaluation  03/04/19    PT Start Time  1112    PT Stop Time  1201    PT Time Calculation (min)  49 min    Activity Tolerance  Patient tolerated treatment well    Behavior During Therapy  [facility] for tasks assessed/performed       Past Medical History:  Diagnosis Date  . Hypertension   . Rheumatic fever    as child    Past Surgical History:  Procedure Laterality Date  . I&D EXTREMITY Left 08/06/2018   Procedure: IRRIGATION AND DEBRIDEMENT EXTREMITY;  Surgeon: Rogers, Jason Patrick, MD;  [facility];  Service: Orthopedics;  Laterality: Left;  . TIBIA IM NAIL INSERTION Left 08/06/2018   Procedure: INTRAMEDULLARY (IM) NAIL TIBIAL;  Surgeon: Rogers, Jason Patrick, MD;  Location: [facility];  Service: Orthopedics;  Laterality: Left;    There were no vitals filed for this visit.  Subjective Assessment - 01/31/19 1113    Subjective  Pt reports that he feels good. He stated that MD wants him to work his back more    Currently in Pain?  No/denies    Pain Score  0-No pain                       [facility] Adult PT Treatment/Exercise - 01/31/19 0001      Lumbar Exercises: Aerobic   Elliptical  I10 R5 2 min each    Nustep  L6 x 3 min       Lumbar Exercises: Machines for Strengthening   Other Lumbar Machine Exercise  Rows & lats 35lb 2x15    Other Lumbar Machine Exercise  chest press 20lb 2x15      Lumbar Exercises: Standing   Other Standing Lumbar Exercises  Overhead back Ext yellow ball 2x10       Knee/Hip Exercises: Machines for Strengthening   Cybex Knee  Flexion  LLE 15lb 2x10    Cybex Leg Press  60lb 3x0, 40lb LLE 3x5       Knee/Hip Exercises: Seated   Long Arc Quad  Left;2 sets;15 reps    Long Arc Quad Weight  3 lbs.      Modalities   Modalities  Electrical Stimulation;Moist Heat      Moist Heat Therapy   Number Minutes Moist Heat  15 Minutes    Moist Heat Location  Lumbar Spine      Electrical Stimulation   Electrical Stimulation Location  lumbar     Electrical Stimulation Action  IFC    Electrical Stimulation Parameters  supine    Electrical Stimulation Goals  Pain               PT Short Term Goals - 01/09/19 1518      PT SHORT TERM GOAL #1   Title  independent with initial HEP    Status  Achieved        PT Long Term Goals - 01/09/19 1518      PT LONG TERM GOAL #1   Title  understand posture and   body mechanics    Status  On-going      PT LONG TERM GOAL #2   Title  decrease LBP 50%    Status  Partially Met      PT LONG TERM GOAL #4   Title  increase left shoulder ER/IR to 45 degrees    Status  On-going            Plan - 01/31/19 1147    Clinical Impression Statement  Pt ~ 12 minutes late for today's treatment, LLE remains very weak overall and affects his gait. Again unable to complete a single  leg extension on machine with light resistance. Some assist needed at times on leg press when using 1 leg. No issues with the increase reps doing seated rows and lats.    Stability/Clinical Decision Making  Evolving/Moderate complexity    Rehab Potential  Good    PT Frequency  3x / week    PT Duration  8 weeks    PT Treatment/Interventions  ADLs/Self Care Home Management;Cryotherapy;Electrical Stimulation;Iontophoresis 44m/ml Dexamethasone;Moist Heat;Ultrasound;Gait training;Stair training;Functional mobility training;Therapeutic activities;Neuromuscular re-education;Balance training;Therapeutic exercise;Patient/family education;Manual techniques    PT Next Visit Plan  slowly start him moving, focus on  back and his gait, then can work on the left shoulder        Patient will benefit from skilled therapeutic intervention in order to improve the following deficits and impairments:  Abnormal gait, Decreased balance, Decreased endurance, Decreased mobility, Difficulty walking, Decreased range of motion, Decreased activity tolerance, Decreased strength, Impaired flexibility, Pain, Impaired UE functional use, Improper body mechanics, Increased muscle spasms  Visit Diagnosis: 1. Stiffness of left shoulder, not elsewhere classified   2. Pain in left ankle and joints of left foot   3. Stiffness of left ankle, not elsewhere classified   4. Difficulty in walking, not elsewhere classified   5. Acute bilateral low back pain without sciatica   6. Acute pain of left shoulder        Problem List Patient Active Problem List   Diagnosis Date Noted  . Open fracture of left tibia 08/06/2018    RScot Jun6/30/2020, 11:55 AM  [address] [address]Phone: [phone]  Fax:  [phone] Name: Joshua WestfallMRN: [id number] of Birth: 1Jul 04, 1982

## 2019-02-02 ENCOUNTER — Encounter: Payer: Self-pay | Admitting: Physical Therapy

## 2019-02-02 ENCOUNTER — Ambulatory Visit: Payer: No Typology Code available for payment source | Attending: Orthopedic Surgery | Admitting: Physical Therapy

## 2019-02-02 ENCOUNTER — Other Ambulatory Visit: Payer: Self-pay

## 2019-02-02 DIAGNOSIS — M25572 Pain in left ankle and joints of left foot: Secondary | ICD-10-CM | POA: Insufficient documentation

## 2019-02-02 DIAGNOSIS — M545 Low back pain, unspecified: Secondary | ICD-10-CM

## 2019-02-02 DIAGNOSIS — M25672 Stiffness of left ankle, not elsewhere classified: Secondary | ICD-10-CM | POA: Insufficient documentation

## 2019-02-02 DIAGNOSIS — M25512 Pain in left shoulder: Secondary | ICD-10-CM | POA: Diagnosis present

## 2019-02-02 DIAGNOSIS — R262 Difficulty in walking, not elsewhere classified: Secondary | ICD-10-CM | POA: Diagnosis present

## 2019-02-02 DIAGNOSIS — M25612 Stiffness of left shoulder, not elsewhere classified: Secondary | ICD-10-CM | POA: Diagnosis not present

## 2019-02-02 NOTE — Therapy (Signed)
[facility] [address] [place], [place], [address] Phone: [phone]   Fax:  [phone]  Physical Therapy Treatment  Patient Details  Name: Joshua Hansen MRN: [id number] Date of Birth: August 22, 1980 Referring Provider (PT): Victorino December   Encounter Date: 02/02/2019  PT End of Session - 02/02/19 1142    Visit Number  7    Date for PT Re-Evaluation  03/04/19    PT Start Time  1103    PT Stop Time  1156    PT Time Calculation (min)  53 min    Activity Tolerance  Patient tolerated treatment well    Behavior During Therapy  [facility] for tasks assessed/performed       Past Medical History:  Diagnosis Date  . Hypertension   . Rheumatic fever    as child    Past Surgical History:  Procedure Laterality Date  . I&D EXTREMITY Left 08/06/2018   Procedure: IRRIGATION AND DEBRIDEMENT EXTREMITY;  Surgeon: Nicholes Stairs, MD;  Location: [place];  Service: Orthopedics;  Laterality: Left;  . TIBIA IM NAIL INSERTION Left 08/06/2018   Procedure: INTRAMEDULLARY (IM) NAIL TIBIAL;  Surgeon: Nicholes Stairs, MD;  Location: [place];  Service: Orthopedics;  Laterality: Left;    There were no vitals filed for this visit.  Subjective Assessment - 02/02/19 1104    Subjective  Pt reports that he is fine, just weakness in LLE and back    Currently in Pain?  Yes    Pain Score  4     Pain Location  Knee                       [facility] Adult PT Treatment/Exercise - 02/02/19 0001      Ambulation/Gait   Stairs  Yes    Stairs Assistance  6: Modified independent (Device/Increase time)    Stair Management Technique  No rails;Alternating pattern    Number of Stairs  48    Height of Stairs  6    Gait Comments  LLE eccentric load weakness.       Exercises   Exercises  Shoulder      Lumbar Exercises: Stretches   Passive Hamstring Stretch  Right;Left;4 reps;10 seconds    Single Knee to Chest Stretch  Right;2  reps;Left;10 seconds    Piriformis Stretch  Right;Left;4 reps;10 seconds      Lumbar Exercises: Aerobic   Elliptical  I10 R5 3 min each    Nustep  L6 x 3 min       Lumbar Exercises: Machines for Strengthening   Other Lumbar Machine Exercise  Rows & lats 45lb 3x10    Other Lumbar Machine Exercise  chest press 25lb 2x15      Lumbar Exercises: Standing   Other Standing Lumbar Exercises  Shoulder flex 4lb and abd 2lb 2x10       Knee/Hip Exercises: Standing   Other Standing Knee Exercises  Cable press dowsn 50lb LLE 2x10       Shoulder Exercises: Standing   External Rotation  Theraband;20 reps;Left;Strengthening    Theraband Level (Shoulder External Rotation)  Level 1 (Yellow)    Internal Rotation  Theraband;20 reps;Left;Strengthening    Theraband Level (Shoulder Internal Rotation)  Level 1 (Yellow)      Modalities   Modalities  Electrical Stimulation;Moist Heat      Moist Heat Therapy   Number Minutes Moist Heat  15 Minutes  Moist Heat Location  Lumbar Spine      Electrical Stimulation   Electrical Stimulation Location  lumbar     Electrical Stimulation Action  IFC    Electrical Stimulation Parameters  supine    Electrical Stimulation Goals  Pain               PT Short Term Goals - 01/09/19 1518      PT SHORT TERM GOAL #1   Title  independent with initial HEP    Status  Achieved        PT Long Term Goals - 01/09/19 1518      PT LONG TERM GOAL #1   Title  understand posture and body mechanics    Status  On-going      PT LONG TERM GOAL #2   Title  decrease LBP 50%    Status  Partially Met      PT LONG TERM GOAL #4   Title  increase left shoulder ER/IR to 45 degrees    Status  On-going            Plan - 02/02/19 1143    Clinical Impression Statement  Eccentric load weakness remains when descending stairs with LLE. LLE is very weak. Increase weight tolerated with seated rows and lats. Some difficulty with LUE abduction, he reports weakness and  pain. LE are very tight in HS and piriforms. Tactile for proper UE placement with ER/IR.    Stability/Clinical Decision Making  Evolving/Moderate complexity    Rehab Potential  Good    PT Treatment/Interventions  ADLs/Self Care Home Management;Cryotherapy;Electrical Stimulation;Iontophoresis 32m/ml Dexamethasone;Moist Heat;Ultrasound;Gait training;Stair training;Functional mobility training;Therapeutic activities;Neuromuscular re-education;Balance training;Therapeutic exercise;Patient/family education;Manual techniques    PT Next Visit Plan  slowly start him moving, focus on back and his gait, then can work on the left shoulder        Patient will benefit from skilled therapeutic intervention in order to improve the following deficits and impairments:  Abnormal gait, Decreased balance, Decreased endurance, Decreased mobility, Difficulty walking, Decreased range of motion, Decreased activity tolerance, Decreased strength, Impaired flexibility, Pain, Impaired UE functional use, Improper body mechanics, Increased muscle spasms  Visit Diagnosis: 1. Stiffness of left shoulder, not elsewhere classified   2. Pain in left ankle and joints of left foot   3. Stiffness of left ankle, not elsewhere classified   4. Difficulty in walking, not elsewhere classified   5. Acute bilateral low back pain without sciatica   6. Acute pain of left shoulder        Problem List Patient Active Problem List   Diagnosis Date Noted  . Open fracture of left tibia 08/06/2018    RScot Jun7/09/2018, 11:45 AM  [address] [address]Phone: [phone]  Fax:  [phone] Name: Joshua KnechtelMRN: [id number] of Birth: 116-Jul-1982

## 2019-02-10 ENCOUNTER — Ambulatory Visit: Payer: No Typology Code available for payment source | Admitting: Physical Therapy

## 2019-02-14 ENCOUNTER — Ambulatory Visit: Payer: No Typology Code available for payment source | Admitting: Physical Therapy

## 2019-02-16 ENCOUNTER — Other Ambulatory Visit: Payer: Self-pay | Admitting: Sports Medicine

## 2019-02-16 ENCOUNTER — Encounter: Payer: Self-pay | Admitting: Physical Therapy

## 2019-02-16 ENCOUNTER — Ambulatory Visit: Payer: No Typology Code available for payment source | Admitting: Physical Therapy

## 2019-02-16 ENCOUNTER — Other Ambulatory Visit: Payer: Self-pay

## 2019-02-16 DIAGNOSIS — M545 Low back pain, unspecified: Secondary | ICD-10-CM

## 2019-02-16 DIAGNOSIS — M25612 Stiffness of left shoulder, not elsewhere classified: Secondary | ICD-10-CM

## 2019-02-16 DIAGNOSIS — M25672 Stiffness of left ankle, not elsewhere classified: Secondary | ICD-10-CM

## 2019-02-16 DIAGNOSIS — R262 Difficulty in walking, not elsewhere classified: Secondary | ICD-10-CM

## 2019-02-16 DIAGNOSIS — G8929 Other chronic pain: Secondary | ICD-10-CM

## 2019-02-16 DIAGNOSIS — M25512 Pain in left shoulder: Secondary | ICD-10-CM

## 2019-02-16 DIAGNOSIS — M25572 Pain in left ankle and joints of left foot: Secondary | ICD-10-CM

## 2019-02-16 NOTE — Therapy (Signed)
[address] [place], [place], [address] Phone: [phone]   Fax:  [phone]  Physical Therapy Treatment  Patient Details  Name: Joshua Hansen MRN: [id number] Date of Birth: 03-11-1981 Referring Provider (PT): Victorino December   Encounter Date: 02/16/2019  PT End of Session - 02/16/19 1621    Visit Number  8    Date for PT Re-Evaluation  03/04/19    PT Start Time  7253    PT Stop Time  1615    PT Time Calculation (min)  45 min    Activity Tolerance  Patient tolerated treatment well    Behavior During Therapy  [facility] for tasks assessed/performed       Past Medical History:  Diagnosis Date  . Hypertension   . Rheumatic fever    as child    Past Surgical History:  Procedure Laterality Date  . I&D EXTREMITY Left 08/06/2018   Procedure: IRRIGATION AND DEBRIDEMENT EXTREMITY;  Surgeon: Nicholes Stairs, MD;  Location: [place];  Service: Orthopedics;  Laterality: Left;  . TIBIA IM NAIL INSERTION Left 08/06/2018   Procedure: INTRAMEDULLARY (IM) NAIL TIBIAL;  Surgeon: Nicholes Stairs, MD;  Location: [place];  Service: Orthopedics;  Laterality: Left;    There were no vitals filed for this visit.  Subjective Assessment - 02/16/19 1532    Subjective  "I have been good"    Patient Stated Goals  walk better, have no pain    Currently in Pain?  No/denies    Pain Score  0-No pain         [facility] PT Assessment - 02/16/19 0001      AROM   Overall AROM Comments  Lumbar ROM WFL. Some LBP with flexion     Left Shoulder Flexion  155 Degrees    Left Shoulder ABduction  140 Degrees    Left Shoulder Internal Rotation  51 Degrees    Left Shoulder External Rotation  72 Degrees                   [facility] Adult PT Treatment/Exercise - 02/16/19 0001      Ambulation/Gait   Stairs  Yes    Stairs Assistance  6: Modified independent (Device/Increase time)    Stair Management Technique  No  rails;Alternating pattern    Number of Stairs  48    Height of Stairs  6    Gait Comments  LLE eccentric load weakness.       Lumbar Exercises: Aerobic   Nustep  L5 x 6 min       Knee/Hip Exercises: Machines for Strengthening   Cybex Knee Extension  10lb 2x10     Cybex Knee Flexion  35lb 2x15, LLE 15lb 2x15     Cybex Leg Press  50lb 2x15, 40lb LLE 3x5       Knee/Hip Exercises: Seated   Long Arc Quad  Left;2 sets;10 reps;Strengthening    Long Arc Quad Weight  5 lbs.               PT Short Term Goals - 01/09/19 1518      PT SHORT TERM GOAL #1   Title  independent with initial HEP    Status  Achieved        PT Long Term Goals - 02/16/19 1546      PT LONG TERM GOAL #1   Title  understand posture and body mechanics  Status  Partially Met      PT LONG TERM GOAL #2   Title  decrease LBP 50%    Status  Partially Met      PT LONG TERM GOAL #3   Title  walk without deviation without device    Status  Partially Met      PT LONG TERM GOAL #4   Title  increase left shoulder ER/IR to 45 degrees    Status  Achieved      PT LONG TERM GOAL #5   Title  increase left ankle DF to 10 degrees    Status  Partially Met   9           Plan - 02/16/19 1622    Clinical Impression Statement  Pt is progressing towards all goals. He has increased his lumbar ROM with some low back pain with flexion. L cues remains weak noted with extensions and stair negotiation. He entered clinic with a back brace that's he got from the MD yesterday. Session without brace. Cues to engage core while on leg press.    Stability/Clinical Decision Making  Evolving/Moderate complexity    Rehab Potential  Good    PT Frequency  3x / week    PT Duration  8 weeks    PT Treatment/Interventions  ADLs/Self Care Home Management;Cryotherapy;Electrical Stimulation;Iontophoresis 24m/ml Dexamethasone;Moist Heat;Ultrasound;Gait training;Stair training;Functional mobility training;Therapeutic  activities;Neuromuscular re-education;Balance training;Therapeutic exercise;Patient/family education;Manual techniques    PT Next Visit Plan  L quas and core strenght. L shoulder ER/IR strength and ROM       Patient will benefit from skilled therapeutic intervention in order to improve the following deficits and impairments:  Abnormal gait, Decreased balance, Decreased endurance, Decreased mobility, Difficulty walking, Decreased range of motion, Decreased activity tolerance, Decreased strength, Impaired flexibility, Pain, Impaired UE functional use, Improper body mechanics, Increased muscle spasms  Visit Diagnosis: 1. Pain in left ankle and joints of left foot   2. Stiffness of left shoulder, not elsewhere classified   3. Difficulty in walking, not elsewhere classified   4. Acute bilateral low back pain without sciatica   5. Stiffness of left ankle, not elsewhere classified   6. Acute pain of left shoulder        Problem List Patient Active Problem List   Diagnosis Date Noted  . Open fracture of left tibia 08/06/2018    RScot Jun PTA 02/16/2019, 4:26 PM  [address] [place] [address]Phone: [phone]  Fax:  [phone] Name: Joshua KidneyMRN: [id number] of Birth: [birth year]

## 2019-02-21 ENCOUNTER — Other Ambulatory Visit: Payer: Self-pay

## 2019-02-21 ENCOUNTER — Ambulatory Visit: Payer: No Typology Code available for payment source | Admitting: Physical Therapy

## 2019-02-21 ENCOUNTER — Encounter: Payer: Self-pay | Admitting: Physical Therapy

## 2019-02-21 DIAGNOSIS — M25612 Stiffness of left shoulder, not elsewhere classified: Secondary | ICD-10-CM

## 2019-02-21 DIAGNOSIS — R262 Difficulty in walking, not elsewhere classified: Secondary | ICD-10-CM

## 2019-02-21 DIAGNOSIS — M545 Low back pain, unspecified: Secondary | ICD-10-CM

## 2019-02-21 DIAGNOSIS — M25572 Pain in left ankle and joints of left foot: Secondary | ICD-10-CM

## 2019-02-21 NOTE — Therapy (Signed)
[address] [place], [place], [address] Phone: [phone]   Fax:  [phone]  Physical Therapy Treatment  Patient Details  Name: Joshua Hansen MRN: [id number] Date of Birth: [birth year] Referring Provider (PT): Victorino December   Encounter Date: 02/21/2019  PT End of Session - 02/21/19 1516    Visit Number  9    Date for PT Re-Evaluation  03/04/19    PT Start Time  1430    PT Stop Time  1515    PT Time Calculation (min)  45 min    Activity Tolerance  Patient tolerated treatment well    Behavior During Therapy  [facility] for tasks assessed/performed       Past Medical History:  Diagnosis Date  . Hypertension   . Rheumatic fever    as child    Past Surgical History:  Procedure Laterality Date  . I&D EXTREMITY Left 08/06/2018   Procedure: IRRIGATION AND DEBRIDEMENT EXTREMITY;  Surgeon: Nicholes Stairs, MD;  Location: [place];  Service: Orthopedics;  Laterality: Left;  . TIBIA IM NAIL INSERTION Left 08/06/2018   Procedure: INTRAMEDULLARY (IM) NAIL TIBIAL;  Surgeon: Nicholes Stairs, MD;  Location: [place];  Service: Orthopedics;  Laterality: Left;    There were no vitals filed for this visit.  Subjective Assessment - 02/21/19 1433    Subjective  "Good"    Limitations  Lifting;House hold activities;Walking    Patient Stated Goals  walk better, have no pain    Currently in Pain?  Yes    Pain Score  5     Pain Location  Back                       [facility] Adult PT Treatment/Exercise - 02/21/19 0001      Ambulation/Gait   Stairs  Yes   skip step in the way up   [place]  6: Modified independent (Device/Increase time)    Stair Management Technique  No rails;Alternating pattern    Number of Stairs  48    Height of Stairs  6    Gait Comments  LLE eccentric load weakness.       Lumbar Exercises: Aerobic   Stationary Bike  L3 x 4 min     Nustep  L6 x 6 min        Lumbar Exercises: Machines for Strengthening   Other Lumbar Machine Exercise  Rows & lats 45lb 2x15    Other Lumbar Machine Exercise  chest press 35lb 2x15      Knee/Hip Exercises: Machines for Strengthening   Cybex Knee Flexion  LLE 20lb 2x15     Cybex Leg Press  50lb 2x15, 30lb LLE 3x5       Knee/Hip Exercises: Standing   Lateral Step Up  Left;2 sets;Hand Hold: 0;Step Height: 6";15 reps      Knee/Hip Exercises: Seated   Long Arc Quad  Left;2 sets;10 reps;Strengthening   3 sec hold   Long Arc Quad Weight  5 lbs.               PT Short Term Goals - 01/09/19 1518      PT SHORT TERM GOAL #1   Title  independent with initial HEP    Status  Achieved        PT Long Term Goals - 02/16/19 1546      PT LONG TERM GOAL #1   Title  understand posture and body mechanics    Status  Partially Met      PT LONG TERM GOAL #2   Title  decrease LBP 50%    Status  Partially Met      PT LONG TERM GOAL #3   Title  walk without deviation without device    Status  Partially Met      PT LONG TERM GOAL #4   Title  increase left shoulder ER/IR to 45 degrees    Status  Achieved      PT LONG TERM GOAL #5   Title  increase left ankle DF to 10 degrees    Status  Partially Met   9           Plan - 02/21/19 1517    Clinical Impression Statement  Overall pt did well. He still demos quad weakness with all interventions. Some difficulty with stair negotiation. L knee pain and weakness when descending stairs. Assist needed with SL on leg press. Pt had some difficulty with LAQ using 5lb weight,cues to contract quad.    Stability/Clinical Decision Making  Evolving/Moderate complexity    Rehab Potential  Good    PT Frequency  3x / week    PT Duration  8 weeks    PT Treatment/Interventions  ADLs/Self Care Home Management;Cryotherapy;Electrical Stimulation;Iontophoresis 13m/ml Dexamethasone;Moist Heat;Ultrasound;Gait training;Stair training;Functional mobility training;Therapeutic  activities;Neuromuscular re-education;Balance training;Therapeutic exercise;Patient/family education;Manual techniques    PT Next Visit Plan  L quas and core strenght. L shoulder ER/IR strength and ROM       Patient will benefit from skilled therapeutic intervention in order to improve the following deficits and impairments:  Abnormal gait, Decreased balance, Decreased endurance, Decreased mobility, Difficulty walking, Decreased range of motion, Decreased activity tolerance, Decreased strength, Impaired flexibility, Pain, Impaired UE functional use, Improper body mechanics, Increased muscle spasms  Visit Diagnosis: 1. Pain in left ankle and joints of left foot   2. Stiffness of left shoulder, not elsewhere classified   3. Difficulty in walking, not elsewhere classified   4. Acute bilateral low back pain without sciatica        Problem List Patient Active Problem List   Diagnosis Date Noted  . Open fracture of left tibia 08/06/2018    RScot Jun PTA 02/21/2019, 3:29 PM  [address] [address]Phone: [phone]  Fax:  [phone] Name: Joshua SiresMRN: [id number] of Birth: 113-Nov-1982

## 2019-02-23 ENCOUNTER — Ambulatory Visit: Payer: No Typology Code available for payment source | Admitting: Physical Therapy

## 2019-03-01 ENCOUNTER — Other Ambulatory Visit: Payer: Self-pay | Admitting: Medical

## 2019-03-01 ENCOUNTER — Encounter: Payer: Self-pay | Admitting: Medical

## 2019-03-01 ENCOUNTER — Ambulatory Visit (INDEPENDENT_AMBULATORY_CARE_PROVIDER_SITE_OTHER): Payer: No Typology Code available for payment source | Admitting: Medical

## 2019-03-01 ENCOUNTER — Other Ambulatory Visit: Payer: Self-pay

## 2019-03-01 VITALS — BP 116/71 | HR 60

## 2019-03-01 DIAGNOSIS — R5383 Other fatigue: Secondary | ICD-10-CM | POA: Diagnosis not present

## 2019-03-01 DIAGNOSIS — Z20828 Contact with and (suspected) exposure to other viral communicable diseases: Secondary | ICD-10-CM | POA: Diagnosis not present

## 2019-03-01 DIAGNOSIS — E785 Hyperlipidemia, unspecified: Secondary | ICD-10-CM

## 2019-03-01 DIAGNOSIS — Z20822 Contact with and (suspected) exposure to covid-19: Secondary | ICD-10-CM

## 2019-03-01 DIAGNOSIS — Z833 Family history of diabetes mellitus: Secondary | ICD-10-CM

## 2019-03-01 NOTE — Patient Instructions (Addendum)
You do have reported fatigue and express concern for covid though not very suspicious signs or symptoms. Since your vitals are stable we can first proceed with getting covid test tomorrow through drive thru test site. I don't think wise to get labs until we get covid test and know negative result. If study negative then can get future fasting labs in our lab.  If during interim you symptoms change or severe fatigue then recommend blood work through the ED.  Follow up date to be determined after lab review.

## 2019-03-01 NOTE — Progress Notes (Signed)
Subjective:    Patient ID: Joshua Hansen, male    DOB: 1980-08-07, 38 y.o.   MRN: [id number]  HPI  Virtual Visit via Video Note  I connected with [name] on 03/01/19 at  3:00 PM EDT by a video enabled telemedicine application and verified that I am speaking with the correct person using two identifiers.  Location: Patient: home Provider: office.   I discussed the limitations of evaluation and management by telemedicine and the availability of in person appointments. The patient expressed understanding and agreed to proceed.  History of Present Illness:  Pt states he recently feels tired. Pt feels fatigued for 5 days. No st. No abdomen. Pt does not think sugar elevated in the past but grandom has diabetes. He had high cholesterol in past. no chest pain. He is very tired despite sleeping 9 hours a day. But no syncope or gait sin  Pt has hx of insomnia. Melatonin helps him sleep more than prior ambien. Last took melatonin 3 days ago. Dose was 10 mg in past.  Wife is concerned that he may have covid. She worries that somehow he may have covid. Although she herself reports no symptoms.   Observations/Objective: General-no acute distress, pleasant, oriented. Lungs- on inspection lungs appear unlabored. Neck- no tracheal deviation or jvd on inspection. Neuro- gross motor function appears intact.  Assessment and Plan: You do have reported fatigue and express concern for covid though not very suspicious signs or symptoms. Since your vitals are stable we can first proceed with getting covid test tomorrow through drive thru test site. I don't think wise to get labs until we get covid test and know negative result. If study negative then can get future fasting labs in our lab.  If during interim you symptoms change or severe fatigue then recommend blood work through the ED.  Follow up date to be determined after lab review.  25 minutes spent with pt.  50% of time spent with pt explaining covid test process and work up going forward.  Follow Up Instructions:    I discussed the assessment and treatment plan with the patient. The patient was provided an opportunity to ask questions and all were answered. The patient agreed with the plan and demonstrated an understanding of the instructions.   The patient was advised to call back or seek an in-person evaluation if the symptoms worsen or if the condition fails to improve as anticipated.  I provided 15  minutes of non-face-to-face time during this encounter.   [name], PA-C   Review of Systems  Constitutional: Positive for fatigue. Negative for chills, diaphoresis and unexpected weight change.  HENT:       No loss of smell.  Respiratory: Negative for cough, chest tightness, shortness of breath and wheezing.   Cardiovascular: Negative for chest pain and palpitations.  Gastrointestinal: Negative for abdominal pain, anal bleeding, blood in stool, constipation, diarrhea, nausea and vomiting.  Endocrine: Negative for polydipsia, polyphagia and polyuria.  Genitourinary: Negative for dysuria.  Musculoskeletal: Negative for back pain, myalgias and neck stiffness.  Skin: Negative for rash.  Neurological: Negative for dizziness, syncope, weakness and headaches.  Hematological: Negative for adenopathy. Does not bruise/bleed easily.  Psychiatric/Behavioral: Negative for behavioral problems, decreased concentration, sleep disturbance and suicidal ideas. The patient is not nervous/anxious.        Mood stable recently.       Objective:   Physical Exam        Assessment &  Plan:

## 2019-03-02 ENCOUNTER — Telehealth: Payer: Self-pay | Admitting: Medical

## 2019-03-02 NOTE — Telephone Encounter (Signed)
Pt wife Ok Edwards to call back after pt has been tested and has COVID results to schedule lab appt

## 2019-03-03 ENCOUNTER — Ambulatory Visit
Admission: RE | Admit: 2019-03-03 | Discharge: 2019-03-03 | Disposition: A | Discharge status: Home / Self Care | Payer: No Typology Code available for payment source | Source: Ambulatory Visit | Attending: Sports Medicine | Admitting: Sports Medicine

## 2019-03-03 ENCOUNTER — Other Ambulatory Visit: Payer: Self-pay

## 2019-03-03 DIAGNOSIS — G8929 Other chronic pain: Secondary | ICD-10-CM

## 2019-03-03 MED ORDER — IOPAMIDOL (ISOVUE-M 200) INJECTION 41%
1.0000 mL | Freq: Once | INTRAMUSCULAR | Status: AC
  Administered 2019-03-03: 13:00:00 1 mL via EPIDURAL

## 2019-03-03 MED ORDER — METHYLPREDNISOLONE ACETATE 40 MG/ML INJ SUSP (RADIOLOG
120.0000 mg | Freq: Once | INTRAMUSCULAR | Status: AC
  Administered 2019-03-03: 13:00:00 120 mg via EPIDURAL

## 2019-03-03 NOTE — Discharge Instructions (Signed)

## 2019-03-07 ENCOUNTER — Other Ambulatory Visit: Payer: Self-pay | Admitting: Medical

## 2019-03-07 ENCOUNTER — Other Ambulatory Visit: Payer: Self-pay

## 2019-03-07 DIAGNOSIS — Z20822 Contact with and (suspected) exposure to covid-19: Secondary | ICD-10-CM

## 2019-03-08 LAB — NOVEL CORONAVIRUS, NAA: SARS-CoV-2, NAA: NOT DETECTED

## 2019-03-10 ENCOUNTER — Other Ambulatory Visit: Payer: No Typology Code available for payment source

## 2019-03-17 ENCOUNTER — Ambulatory Visit (INDEPENDENT_AMBULATORY_CARE_PROVIDER_SITE_OTHER): Payer: No Typology Code available for payment source | Admitting: Medical

## 2019-03-17 ENCOUNTER — Other Ambulatory Visit (INDEPENDENT_AMBULATORY_CARE_PROVIDER_SITE_OTHER): Payer: No Typology Code available for payment source

## 2019-03-17 ENCOUNTER — Other Ambulatory Visit: Payer: Self-pay

## 2019-03-17 ENCOUNTER — Ambulatory Visit: Payer: No Typology Code available for payment source | Admitting: Medical

## 2019-03-17 DIAGNOSIS — F32A Depression, unspecified: Secondary | ICD-10-CM

## 2019-03-17 DIAGNOSIS — R5383 Other fatigue: Secondary | ICD-10-CM

## 2019-03-17 DIAGNOSIS — F329 Major depressive disorder, single episode, unspecified: Secondary | ICD-10-CM

## 2019-03-17 DIAGNOSIS — G4700 Insomnia, unspecified: Secondary | ICD-10-CM | POA: Diagnosis not present

## 2019-03-17 DIAGNOSIS — F419 Anxiety disorder, unspecified: Secondary | ICD-10-CM

## 2019-03-17 DIAGNOSIS — E785 Hyperlipidemia, unspecified: Secondary | ICD-10-CM

## 2019-03-17 DIAGNOSIS — R7303 Prediabetes: Secondary | ICD-10-CM

## 2019-03-17 DIAGNOSIS — Z833 Family history of diabetes mellitus: Secondary | ICD-10-CM

## 2019-03-17 DIAGNOSIS — R739 Hyperglycemia, unspecified: Secondary | ICD-10-CM

## 2019-03-17 LAB — COMPREHENSIVE METABOLIC PANEL
ALT: 11 U/L (ref 0–53)
AST: 8 U/L (ref 0–37)
Albumin: 4.5 g/dL (ref 3.5–5.2)
Alkaline Phosphatase: 77 U/L (ref 39–117)
BUN: 12 mg/dL (ref 6–23)
CO2: 27 mEq/L (ref 19–32)
Calcium: 9.6 mg/dL (ref 8.4–10.5)
Chloride: 104 mEq/L (ref 96–112)
Creatinine, Ser: 0.85 mg/dL (ref 0.40–1.50)
GFR: 100.96 mL/min (ref >60.00)
Glucose, Bld: 102 mg/dL — ABNORMAL HIGH (ref 70–99)
Potassium: 4 mEq/L (ref 3.5–5.1)
Sodium: 138 mEq/L (ref 135–145)
Total Bilirubin: 0.4 mg/dL (ref 0.2–1.2)
Total Protein: 6.7 g/dL (ref 6.0–8.3)

## 2019-03-17 LAB — CBC WITH DIFFERENTIAL/PLATELET
Basophils Absolute: 0 10*3/uL (ref 0.0–0.1)
Basophils Relative: 0.5 % (ref 0.0–3.0)
Eosinophils Absolute: 0.1 10*3/uL (ref 0.0–0.7)
Eosinophils Relative: 1.1 % (ref 0.0–5.0)
HCT: 45.5 % (ref 39.0–52.0)
Hemoglobin: 15.1 g/dL (ref 13.0–17.0)
Lymphocytes Relative: 36.8 % (ref 12.0–46.0)
Lymphs Abs: 3.4 10*3/uL (ref 0.7–4.0)
MCHC: 33.2 g/dL (ref 30.0–36.0)
MCV: 89.8 fl (ref 78.0–100.0)
Monocytes Absolute: 0.8 10*3/uL (ref 0.1–1.0)
Monocytes Relative: 8.5 % (ref 3.0–12.0)
Neutro Abs: 4.9 10*3/uL (ref 1.4–7.7)
Neutrophils Relative %: 53.1 % (ref 43.0–77.0)
Platelets: 274 10*3/uL (ref 150.0–400.0)
RBC: 5.07 Mil/uL (ref 4.22–5.81)
RDW: 14.3 % (ref 11.5–15.5)
WBC: 9.2 10*3/uL (ref 4.0–10.5)

## 2019-03-17 LAB — LIPID PANEL
Cholesterol: 239 mg/dL — ABNORMAL HIGH (ref 0–200)
HDL: 47.2 mg/dL (ref >39.00)
LDL Cholesterol: 169 mg/dL — ABNORMAL HIGH (ref 0–99)
NonHDL: 191.8
Total CHOL/HDL Ratio: 5
Triglycerides: 112 mg/dL (ref 0.0–149.0)
VLDL: 22.4 mg/dL (ref 0.0–40.0)

## 2019-03-17 LAB — VITAMIN B12: Vitamin B-12: 389 pg/mL (ref 211–911)

## 2019-03-17 LAB — HEMOGLOBIN A1C: Hgb A1c MFr Bld: 5.6 % (ref 4.6–6.5)

## 2019-03-17 LAB — TSH: TSH: 2.4 u[IU]/mL (ref 0.35–4.50)

## 2019-03-17 MED ORDER — HYDROXYZINE HCL 25 MG PO TABS: ORAL_TABLET | ORAL | 0 refills | Status: DC

## 2019-03-17 NOTE — Progress Notes (Signed)
Subjective:    Patient ID: Joshua Hansen, male    DOB: [birth year], 38 y.o.   MRN: [id number]  HPI  Virtual Visit via Video Note  I connected with [name] on 03/17/19 at  1:20 PM EDT by a video enabled telemedicine application and verified that I am speaking with the correct person using two identifiers.  Location: Patient: home Provider: home   I discussed the limitations of evaluation and management by telemedicine and the availability of in person appointments. The patient expressed understanding and agreed to proceed.  Pt did not take vitals today.  History of Present Illness:  Pt states having intermittent episodes of insomnia on and off 3 nights a week. But struggles to go to sleep more than that. Pt has taken melotonin and seems to make him feel sluggish. Tried ambien and felt sluggish. He has not used clonzepam for at least a month. Anxiety is now better.  Pt mood/depression is improving. He is less depressed now and wants to decrease dose of sertaline 25 mg daily. Family thinking of having a baby. They had lost child in [place] and have struggled with loss of child. Both [name] and wife are now recovering emotionally and expressing desire to consider having other child.  Pt has high cholesterol. No known family hx of mi or CAD. He admits has room to improve diet.      Observations/Objective: General-no acute distress, pleasant, oriented. Lungs- on inspection lungs appear unlabored. Neck- no tracheal deviation or jvd on inspection. Neuro- gross motor function appears intact.  Assessment and Plan: I am glad to hear that your mood is much better now.  You report wanting to try coming off of the sertraline medication.  I do think is reasonable that you could decrease sertraline down to 25 mg daily.  We can see how you do over the next month and if your mood remains stable/good then the following month you could taper to 1 tablet every other day.   Goal to stop depression medication about 6 to 8 weeks provided that your mood remains stable.  Also glad to hear that your anxiety is much improved and you do not need to use clonazepam.  You do have some persistent insomnia and I do think this is likely cause of your described fatigue.  I think this is the case since your lab work-up came back negative.  We discussed various medication options and I think will be reasonable to try hydroxyzine 25mg  on days that you have difficulty falling asleep.  Rx advisement given.  For high cholesterol, recommend low-cholesterol diet, some daily exercise and fish oil tablets over-the-counter.  Repeat lipid panel in 3 to 6 months.  [name] and wife are considering having another child.  In light of traumatic event and loss of a child in the past did mention to both him and his wife that they might consider talking with counselor prior to get beginning their efforts to become pregnant.  Follow-up in 1 month or as needed.  Follow Up Instructions:    I discussed the assessment and treatment plan with the patient. The patient was provided an opportunity to ask questions and all were answered. The patient agreed with the plan and demonstrated an understanding of the instructions.   The patient was advised to call back or seek an in-person evaluation if the symptoms worsen or if the condition fails to improve as anticipated.  I provided  25 minutes of non-face-to-face time  during this encounter.   [name], PA-C    Review of Systems  Constitutional: Negative for chills, fatigue and fever.  Respiratory: Negative for cough, choking and wheezing.   Cardiovascular: Negative for chest pain and palpitations.  Gastrointestinal: Negative for abdominal pain.  Musculoskeletal: Negative for back pain.  Skin: Negative for rash.  Neurological: Negative for dizziness, speech difficulty and headaches.  Hematological: Negative for adenopathy.   Psychiatric/Behavioral: Positive for sleep disturbance. Negative for behavioral problems. The patient is not nervous/anxious.        Mood and anxiety is better.    Past Medical History:  Diagnosis Date  . Hypertension   . Rheumatic fever    as child     Social History   Socioeconomic History  . Marital status: Single    Spouse name: Not on file  . Number of children: Not on file  . Years of education: Not on file  . Highest education level: Not on file  Occupational History  . Not on file  Social Needs  . Financial resource strain: Not on file  . Food insecurity    Worry: Not on file    Inability: Not on file  . Transportation needs    Medical: Not on file    Non-medical: Not on file  Tobacco Use  . Smoking status: Never Smoker  . Smokeless tobacco: Never Used  Substance and Sexual Activity  . Alcohol use: Not Currently    Frequency: Never  . Drug use: Not on file  . Sexual activity: Not on file  Lifestyle  . Physical activity    Days per week: Not on file    Minutes per session: Not on file  . Stress: Not on file  Relationships  . Social Herbalist on phone: Not on file    Gets together: Not on file    Attends religious service: Not on file    Active member of club or organization: Not on file    Attends meetings of clubs or organizations: Not on file    Relationship status: Not on file  . Intimate partner violence    Fear of current or ex partner: Not on file    Emotionally abused: Not on file    Physically abused: Not on file    Forced sexual activity: Not on file  Other Topics Concern  . Not on file  Social History Narrative  . Not on file    Past Surgical History:  Procedure Laterality Date  . I&D EXTREMITY Left 08/06/2018   Procedure: IRRIGATION AND DEBRIDEMENT EXTREMITY;  Surgeon: Nicholes Stairs, MD;  Location: [place];  Service: Orthopedics;  Laterality: Left;  . TIBIA IM NAIL INSERTION Left 08/06/2018   Procedure: INTRAMEDULLARY  (IM) NAIL TIBIAL;  Surgeon: Nicholes Stairs, MD;  Location: [place];  Service: Orthopedics;  Laterality: Left;    No family history on file.  No Known Allergies  Current Outpatient Medications on File Prior to Visit  Medication Sig Dispense Refill  . amLODipine (NORVASC) 5 MG tablet TAKE 1 TABLET (5 MG TOTAL) BY MOUTH DAILY. 90 tablet 0  . clonazePAM (KLONOPIN) 0.5 MG tablet Take 1 tablet (0.5 mg total) by mouth 2 (two) times daily as needed for anxiety. 20 tablet 0  . lisinopril (PRINIVIL,ZESTRIL) 40 MG tablet Take 1 tablet (40 mg total) by mouth daily. 90 tablet 3  . metoCLOPramide (REGLAN) 5 MG tablet Take 1-2 tablets (5-10 mg total) by mouth every 8 (eight)  hours as needed for nausea (if ondansetron (ZOFRAN) ineffective.).    Marland Kitchen sertraline (ZOLOFT) 50 MG tablet TAKE 1 TABLET (50 MG TOTAL) BY MOUTH DAILY. 30 tablet 3  . terbinafine (LAMISIL) 250 MG tablet Take 1 tablet (250 mg total) by mouth daily. 28 tablet 2   No current facility-administered medications on file prior to visit.     There were no vitals taken for this visit.      Objective:   Physical Exam        Assessment & Plan:

## 2019-03-17 NOTE — Patient Instructions (Addendum)
I am glad to hear that your mood is much better now.  You report wanting to try coming off of the sertraline medication.  I do think is reasonable that you could decrease sertraline down to 25 mg daily.  We can see how you do over the next month and if your mood remains stable/good then the following month you could taper to 1 tablet every other day.  Goal to stop depression medication about 6 to 8 weeks provided that your mood remains stable.  Also glad to hear that your anxiety is much improved and you do not need to use clonazepam.  You do have some persistent insomnia and I do think this is likely cause of your described fatigue.  I think this is the case since your lab work-up came back negative.  We discussed various medication options and I think will be reasonable to try hydroxyzine 25mg  on days that you have difficulty falling asleep.  Rx advisement given.  For high cholesterol, recommend low-cholesterol diet, some daily exercise and fish oil tablets over-the-counter.  Repeat lipid panel in 3 to 6 months.  Nima and wife are considering having another child.  In light of traumatic event and loss of a child in the past did mention to both him and his wife that they might consider talking with counselor prior to get beginning their efforts to become pregnant.  Follow-up in 1 month or as needed.

## 2019-03-19 ENCOUNTER — Telehealth: Payer: Self-pay | Admitting: Medical

## 2019-03-19 NOTE — Telephone Encounter (Signed)
Attorney form requesting records and billing information.If you would review that and forward to correct person. Also note bottom section not filled out   Printed name of patients representative and relationship to patient.  Will drop by your desk.

## 2019-03-24 ENCOUNTER — Encounter: Payer: Self-pay | Admitting: Medical

## 2019-03-24 ENCOUNTER — Telehealth: Payer: Self-pay | Admitting: Medical

## 2019-03-24 LAB — VITAMIN B1: Vitamin B1 (Thiamine): 8 nmol/L (ref 8–30)

## 2019-03-24 MED ORDER — SERTRALINE HCL 25 MG PO TABS: 25.0000 mg | ORAL_TABLET | Freq: Every day | ORAL | 3 refills | Status: DC

## 2019-03-24 NOTE — Telephone Encounter (Signed)
Rx lower dose sent to [facility].

## 2019-05-08 ENCOUNTER — Other Ambulatory Visit: Payer: Self-pay | Admitting: Medical

## 2019-05-08 NOTE — Telephone Encounter (Signed)
Refill Request: Clonazepam  Last RX: 11/29/18 Last OV:03/24/19 Next OV: 05/15/19 UDS: CSC: CSR:

## 2019-05-09 NOTE — Telephone Encounter (Signed)
I declined his rx. I am seeing him tomorrow virtual visit. But he needs to sign contract and give uds. So not sure if virtual visit the way to go. Or can you have them come by this afternoon sign contact and give uds. I could rx 30 tab rx tomorrow on virtual visit.

## 2019-05-10 ENCOUNTER — Encounter: Payer: Self-pay | Admitting: Medical

## 2019-05-10 ENCOUNTER — Other Ambulatory Visit: Payer: Self-pay

## 2019-05-10 ENCOUNTER — Telehealth (INDEPENDENT_AMBULATORY_CARE_PROVIDER_SITE_OTHER): Payer: No Typology Code available for payment source | Admitting: Medical

## 2019-05-10 VITALS — BP 121/75 | HR 76

## 2019-05-10 DIAGNOSIS — F419 Anxiety disorder, unspecified: Secondary | ICD-10-CM

## 2019-05-10 DIAGNOSIS — F32A Depression, unspecified: Secondary | ICD-10-CM

## 2019-05-10 DIAGNOSIS — N529 Male erectile dysfunction, unspecified: Secondary | ICD-10-CM | POA: Diagnosis not present

## 2019-05-10 DIAGNOSIS — F329 Major depressive disorder, single episode, unspecified: Secondary | ICD-10-CM | POA: Diagnosis not present

## 2019-05-10 MED ORDER — BUPROPION HCL ER (XL) 150 MG PO TB24: 150.0000 mg | ORAL_TABLET | Freq: Every day | ORAL | 0 refills | Status: DC

## 2019-05-10 MED ORDER — SILDENAFIL CITRATE 50 MG PO TABS: ORAL_TABLET | ORAL | 0 refills | Status: DC

## 2019-05-10 NOTE — Patient Instructions (Signed)
Your mood is improved again on 50 mg dose of sertraline. Your anxiety is controlled and you are using clonazepam sparingly. Will have you come in and signs controlled med contract and give uds next week.  Since you do have ED will have you start Wellbutrin 150 mg daily and continue on sertraline 50 mg dose. Then in one week reduce sertraline to 25 mg daily. Then at 2 weeks stop sertraline. Then will see how you do on wellbutrin alone. With ssri stopped erectile function may improve.  Also decided to go ahead and prescribe viagra as this may help as well.  Follow up in 2 week or as needed.      Sndrome de la serotonina Serotonin Syndrome La serotonina es una sustancia qumica en el cuerpo (neurotransmisor) que ayuda a Chief Technology Officer varias funciones, por ejemplo:  La funcin de las clulas del sistema nervioso y del cerebro.  [place] de nimo y las emociones.  La memoria.  Comer.  Dormir.  La actividad sexual.  [place] al estrs. Tener demasiada serotonina en el organismo pueden causar el sndrome de la serotonina. Esta afeccin puede ser perjudicial para las clulas del sistema nervioso y del cerebro. Puede ser Ardelia Mems afeccin potencialmente mortal. Cules son las causas? Esta afeccin puede ocurrir al tomar Dynegy o consumir drogas que aumentan el nivel de serotonina en el [place], tales como:  Antidepresivos.  Medicamentos para la migraa.  Determinados analgsicos.  Determinadas drogas, entre ellas, xtasis, LSD, cocana y anfetaminas.  Medicamentos de venta libre para la tos o para el resfro que contienen dextrometorfano.  Determinados suplementos herbarios, entre ellos, [place], ginseng y [place]. Generalmente, esta afeccin ocurre cuando estos medicamentos y estas drogas se toman juntos, pero tambin puede presentarse con una dosis alta de un nico medicamento o una nica droga. Qu incrementa el riesgo? Es ms probable que usted sufra  esta afeccin si:  Acaba de empezar a tomar un medicamento o una droga que aumenta el nivel de serotonina en el organismo.  Recientemente, ha aumentado la dosis de Ecologist o droga que aumenta el nivel de serotonina en el organismo.  Toma ms de un medicamento o droga que aumenta el nivel de serotonina en el organismo. Cules son los signos o los sntomas? Generalmente, los sntomas de esta afeccin se manifiestan despus de varias horas de tomar el medicamento o la droga. Los sntomas pueden ser leves o graves. Los sntomas leves incluyen:  Sudoracin.  Inquietud o agitacin.  Rigidez o temblores musculares.  Frecuencia cardaca acelerada.  Nuseas y vmitos.  Diarrea.  Dolor de [place].  [place].  Confusin. Entre los sntomas graves, se incluyen los siguientes:  Latidos Licensed conveyancer.  Convulsiones.  Prdida del conocimiento.  Fiebre alta. Cmo se diagnostica? Esta afeccin se puede diagnosticar en funcin de lo siguiente:  Sus antecedentes mdicos.  Un examen fsico.  Su uso anterior de drogas y medicamentos.  Anlisis de [place] y [place]. Estos pueden servir para descartar otras causas de sus sntomas. Cmo se trata? El tratamiento de esta afeccin depende de la gravedad de los sntomas.  Cuando los casos son leves, a menudo todo lo que se necesita es suspender el medicamento o la droga que caus la afeccin.  Cuando los casos son moderados o graves, puede ser necesario el tratamiento en un hospital para prevenir o controlar sntomas potencialmente mortales. Esto puede incluir medicamentos para controlar los sntomas, lquidos intravenosos (i.v.), intervenciones para ayudar con la respiracin  y tratamientos para Scientist, clinical (histocompatibility and immunogenetics). Siga estas indicaciones en su casa: Medicamentos   Tome los medicamentos de venta libre y los recetados solamente como se lo haya indicado el mdico. Esto es importante.   Consulte al mdico antes de tomar medicamentos recetados o de venta libre, hierbas o suplementos por primera vez.  No combine ningn medicamento que pueda causar esta afeccin. Estilo de vida   Lleve un estilo de vida saludable. ? Siga una dieta saludable que incluya abundantes frutas, verduras, cereales integrales, productos lcteos descremados y protenas magras. No consuma muchos alimentos con alto contenido de grasas, azcares agregados o sal. ? Duerma bien y por el tiempo adecuado. La [employer] de los adultos necesitan entre 7y9horas de sueo todas las noches. ? Dedique tiempo a Energy manager, aunque sea solo por cortos perodos de [place]. La mayora de los adultos deben hacer ejercicio durante al menos 182minutos por semana. ? No beba alcohol. ? No consuma drogas ilegales y no tome medicamentos por otros motivos para los cuales no hayan sido recetados. Instrucciones generales  No consuma ningn producto que contenga nicotina o tabaco, como cigarrillos y Psychologist, sport and exercise. Si necesita ayuda para dejar de fumar, consulte al mdico.  Concurra a todas las visitas de seguimiento como se lo haya indicado el mdico. Esto es importante. Comunquese con un mdico si:  Los sntomas no mejoran o empeoran. Solicite ayuda inmediatamente si:  Tiene confusin que [place], dolor de cabeza intenso, dolor de [place], fiebre alta, convulsiones o prdida de la conciencia.  Los [employer] producen efectos secundarios graves, como hinchazn del rostro, los labios, la lengua o Patent examiner.  Tiene pensamientos graves acerca de Glass blower/designer a s mismo o a Producer, television/film/video. Estos sntomas pueden representar un problema grave que constituye Engineer, maintenance (IT). No espere hasta que los sntomas desaparezcan. Solicite atencin mdica de inmediato. Comunquese con el servicio de emergencias de su localidad (911 en los [place]). No conduzca por sus propios medios Principal Financial. Si alguna  vez siente que puede lastimarse a usted mismo o a Producer, television/film/video, o tiene pensamientos de poner fin a su vida, busque ayuda de inmediato. Puede dirigirse al servicio de emergencias ms cercano o comunicarse con:  El servicio de emergencias de su localidad (911 en [place]  Una lnea de asistencia al suicida y Freight forwarder en crisis, como la [employer] de Prevencin del Suicidio (National Suicide Prevention Lifeline), al [phone]. Est disponible las 24 horas del da. Resumen  La serotonina es una sustancia qumica del cerebro que ayuda a regular el sistema nervioso. Los [employer] altos de serotonina en el organismo pueden causar el sndrome de la serotonina, una afeccin muy peligrosa.  Esta afeccin puede ocurrir al tomar Dynegy o consumir drogas que aumentan el nivel de serotonina en el organismo.  El tratamiento variar segn la gravedad de los sntomas. Cuando los casos son leves, a menudo todo lo que se necesita es suspender el medicamento o la droga que caus la afeccin.  Consulte al mdico antes de tomar medicamentos recetados o de venta libre, hierbas o suplementos por primera vez. Esta informacin no tiene Marine scientist el consejo del mdico. Asegrese de hacerle al mdico cualquier pregunta que tenga. Document Released: 07/20/2005 Document Revised: 09/25/2017 Document Reviewed: 09/25/2017 Elsevier Patient Education  2020 [employer].

## 2019-05-10 NOTE — Telephone Encounter (Signed)
Pt wife states she has to come back with him so they will keep the virtual and come next week to do UDS and contract.

## 2019-05-10 NOTE — Progress Notes (Signed)
Virtual Visit via Video Note  I connected with Joshua Hansen on 05/10/19 at  4:00 PM EDT by a video enabled telemedicine application and verified that I am speaking with the correct person using two identifiers.  Location: Patient: home Provider: office.   I discussed the limitations of evaluation and management by telemedicine and the availability of in person appointments. The patient expressed understanding and agreed to proceed.  History of Present Illness: Pt states when he attempted to reduce sertraline to 25 mg but  he got depressed. So he went back up to 50 mg and felt good again. Formerly on 50 mg dose but he had hopes of tapering off as he felt well.  Hx of anxiety and he is on clonazepam. Wife states he is using the medicine rarely.  Just recently some ED. Some decreased libido since being on the zoloft. But wife states he had periodic ED in the past. He also had some ED after death of his daughter even before he was on SSRI.But now over past month his ED seems worse.  Pt never had seizures. He never had eating disorder. Never used wellbutrin.      Observations/Objective: General-no acute distress, pleasant, oriented. Lungs- on inspection lungs appear unlabored. Neck- no tracheal deviation or jvd on inspection. Neuro- gross motor function appears intact.  Assessment and Plan: Your mood is improved again on 50 mg dose of sertraline. Your anxiety is controlled and you are using clonazepam sparingly. Will have you come in and signs controlled med contract and give uds next week.  Since you do have ED will have you start Wellbutrin 150 mg daily and continue on sertraline 50 mg dose. Then in one week reduce sertraline to 25 mg daily. Then at 2 weeks stop sertraline. Then will see how you do on wellbutrin alone. With ssri stopped erectile function may improve.  Also decided to go ahead and prescribe viagra as this may help as well.  Follow up in 2 week or as  neede  Follow Up Instructions:    I discussed the assessment and treatment plan with the patient. The patient was provided an opportunity to ask questions and all were answered. The patient agreed with the plan and demonstrated an understanding of the instructions.   The patient was advised to call back or seek an in-person evaluation if the symptoms worsen or if the condition fails to improve as anticipated.  I provided 25  minutes of non-face-to-face time during this encounter.   [name], PA-C

## 2019-05-16 ENCOUNTER — Other Ambulatory Visit: Payer: No Typology Code available for payment source

## 2019-05-16 ENCOUNTER — Telehealth: Payer: Self-pay | Admitting: *Deleted

## 2019-05-16 DIAGNOSIS — Z79899 Other long term (current) drug therapy: Secondary | ICD-10-CM

## 2019-05-16 NOTE — Telephone Encounter (Signed)
He is supposed to be giving uds and sign contract. He is on clonazepam.

## 2019-05-16 NOTE — Telephone Encounter (Signed)
Will you put in order for uds and prepare pt contract for clonazpam. He is coming in today for both.

## 2019-05-16 NOTE — Telephone Encounter (Signed)
UDS order place and contacted printed.

## 2019-05-16 NOTE — Telephone Encounter (Signed)
Ok, can you place future UDS order and print the contract? I will delete the old cmet order in Epic.

## 2019-05-16 NOTE — Telephone Encounter (Signed)
Pt has lab appt scheduled for tomorrow. The only future order in Epic is a cmet from 09/21/18. Pt had a cmet in August. Is this what he needs or is there something else he is coming in for?

## 2019-05-16 NOTE — Telephone Encounter (Signed)
Correction:  Lab appt is today. Please see below and advise?

## 2019-05-17 ENCOUNTER — Telehealth: Payer: Self-pay | Admitting: Medical

## 2019-05-17 NOTE — Telephone Encounter (Signed)
Per lab pt did not show up for uds. Will you call him/his wife and get him rescheduled.

## 2019-05-22 ENCOUNTER — Telehealth: Payer: Self-pay | Admitting: Medical

## 2019-05-22 ENCOUNTER — Encounter: Payer: Self-pay | Admitting: Medical

## 2019-05-22 NOTE — Telephone Encounter (Signed)
DC wellbutrin and stay on sertraline.

## 2019-06-02 ENCOUNTER — Other Ambulatory Visit: Payer: No Typology Code available for payment source

## 2019-06-02 ENCOUNTER — Telehealth: Payer: Self-pay | Admitting: Medical

## 2019-06-02 ENCOUNTER — Encounter: Payer: Self-pay | Admitting: Medical

## 2019-06-02 ENCOUNTER — Other Ambulatory Visit: Payer: Self-pay

## 2019-06-02 DIAGNOSIS — Z79899 Other long term (current) drug therapy: Secondary | ICD-10-CM

## 2019-06-02 MED ORDER — SILDENAFIL CITRATE 50 MG PO TABS: ORAL_TABLET | ORAL | 0 refills | Status: DC

## 2019-06-02 MED ORDER — CLONAZEPAM 0.5 MG PO TABS: 0.5000 mg | ORAL_TABLET | Freq: Two times a day (BID) | ORAL | 0 refills | Status: DC | PRN

## 2019-06-02 NOTE — Telephone Encounter (Signed)
Opened to review and rx meds.

## 2019-06-02 NOTE — Telephone Encounter (Signed)
Rx viagra and clonazepam sent to pt pharmacy.

## 2019-06-03 LAB — PAIN MGMT, PROFILE 8 W/CONF, U
6 Acetylmorphine: NEGATIVE ng/mL
Alcohol Metabolites: NEGATIVE ng/mL (ref <500)
Amphetamines: NEGATIVE ng/mL
Benzodiazepines: NEGATIVE ng/mL
Buprenorphine, Urine: NEGATIVE ng/mL
Cocaine Metabolite: NEGATIVE ng/mL
Creatinine: 178.2 mg/dL
MDMA: NEGATIVE ng/mL
Marijuana Metabolite: NEGATIVE ng/mL
Opiates: NEGATIVE ng/mL
Oxidant: NEGATIVE ug/mL
Oxycodone: NEGATIVE ng/mL
pH: 7.7 (ref 4.5–9.0)

## 2019-06-30 ENCOUNTER — Other Ambulatory Visit: Payer: Self-pay | Admitting: Medical

## 2019-07-03 ENCOUNTER — Encounter: Payer: Self-pay | Admitting: Medical

## 2019-07-03 NOTE — Addendum Note (Signed)
Addended by: Anabel Halon on: 07/03/2019 08:36 PM   Modules accepted: Orders

## 2019-07-05 ENCOUNTER — Telehealth: Payer: Self-pay | Admitting: Medical

## 2019-07-05 NOTE — Telephone Encounter (Signed)
Pt wants to be referred to  [facility] [address] in [place]. Same practice his wife is going to. See referral.

## 2019-08-09 ENCOUNTER — Ambulatory Visit: Payer: No Typology Code available for payment source | Admitting: Registered"

## 2019-08-14 ENCOUNTER — Ambulatory Visit: Payer: No Typology Code available for payment source | Admitting: Medical

## 2019-08-14 DIAGNOSIS — Z0289 Encounter for other administrative examinations: Secondary | ICD-10-CM

## 2019-08-29 DIAGNOSIS — F39 Unspecified mood [affective] disorder: Secondary | ICD-10-CM | POA: Diagnosis not present

## 2019-09-05 DIAGNOSIS — F39 Unspecified mood [affective] disorder: Secondary | ICD-10-CM | POA: Diagnosis not present

## 2019-09-19 DIAGNOSIS — F39 Unspecified mood [affective] disorder: Secondary | ICD-10-CM | POA: Diagnosis not present

## 2019-09-28 DIAGNOSIS — F39 Unspecified mood [affective] disorder: Secondary | ICD-10-CM | POA: Diagnosis not present

## 2019-10-05 DIAGNOSIS — F39 Unspecified mood [affective] disorder: Secondary | ICD-10-CM | POA: Diagnosis not present

## 2019-10-12 DIAGNOSIS — F39 Unspecified mood [affective] disorder: Secondary | ICD-10-CM | POA: Diagnosis not present

## 2019-10-21 DIAGNOSIS — F39 Unspecified mood [affective] disorder: Secondary | ICD-10-CM | POA: Diagnosis not present

## 2019-10-26 DIAGNOSIS — F39 Unspecified mood [affective] disorder: Secondary | ICD-10-CM | POA: Diagnosis not present

## 2019-11-02 DIAGNOSIS — F39 Unspecified mood [affective] disorder: Secondary | ICD-10-CM | POA: Diagnosis not present

## 2019-11-10 DIAGNOSIS — F39 Unspecified mood [affective] disorder: Secondary | ICD-10-CM | POA: Diagnosis not present

## 2019-11-13 ENCOUNTER — Other Ambulatory Visit: Payer: Self-pay | Admitting: Medical

## 2019-11-13 NOTE — Telephone Encounter (Addendum)
Requesting:[place] Contract: 12.02.20 UDS:10.30.20 Last Visit:8.14.20 Next Visit:n./a Last Refill:10.30.20  Please Advise  Rx refill sent to pt pharmacy.

## 2019-11-17 DIAGNOSIS — F39 Unspecified mood [affective] disorder: Secondary | ICD-10-CM | POA: Diagnosis not present

## 2019-11-18 ENCOUNTER — Encounter: Payer: Self-pay | Admitting: Medical

## 2019-11-20 ENCOUNTER — Other Ambulatory Visit: Payer: Self-pay | Admitting: Medical

## 2019-11-20 MED ORDER — SILDENAFIL CITRATE 50 MG PO TABS: ORAL_TABLET | ORAL | 0 refills | Status: DC

## 2019-12-08 DIAGNOSIS — F39 Unspecified mood [affective] disorder: Secondary | ICD-10-CM | POA: Diagnosis not present

## 2019-12-15 DIAGNOSIS — F39 Unspecified mood [affective] disorder: Secondary | ICD-10-CM | POA: Diagnosis not present

## 2019-12-18 ENCOUNTER — Other Ambulatory Visit: Payer: Self-pay | Admitting: Sports Medicine

## 2019-12-18 ENCOUNTER — Encounter: Payer: Self-pay | Admitting: Medical

## 2019-12-19 ENCOUNTER — Other Ambulatory Visit: Payer: Self-pay

## 2019-12-19 ENCOUNTER — Ambulatory Visit (INDEPENDENT_AMBULATORY_CARE_PROVIDER_SITE_OTHER): Payer: 59 | Admitting: Medical

## 2019-12-19 VITALS — BP 128/71 | HR 70 | Resp 18 | Ht 69.0 in | Wt 199.6 lb

## 2019-12-19 DIAGNOSIS — M5441 Lumbago with sciatica, right side: Secondary | ICD-10-CM

## 2019-12-19 DIAGNOSIS — M5442 Lumbago with sciatica, left side: Secondary | ICD-10-CM | POA: Diagnosis not present

## 2019-12-19 DIAGNOSIS — G8929 Other chronic pain: Secondary | ICD-10-CM

## 2019-12-19 MED ORDER — METHOCARBAMOL 500 MG PO TABS: 500.0000 mg | ORAL_TABLET | Freq: Three times a day (TID) | ORAL | 0 refills | Status: DC | PRN

## 2019-12-19 MED ORDER — METHYLPREDNISOLONE ACETATE 40 MG/ML IJ SUSP
40.0000 mg | Freq: Once | INTRAMUSCULAR | Status: AC
  Administered 2019-12-19: 40 mg via INTRAMUSCULAR
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
## 2019-12-19 NOTE — Patient Instructions (Signed)
For hx of back pain with radicular pain, we gave you depo medrol 40 mg im injection. Can use robaxin if needed. Rx advisement given.   Can resume alleve over th counter tomorrow.  Refer to back specialist for probable epidural.  Follow up 1 month or as needed.

## 2019-12-22 DIAGNOSIS — F39 Unspecified mood [affective] disorder: Secondary | ICD-10-CM | POA: Diagnosis not present

## 2020-01-03 ENCOUNTER — Encounter: Payer: Self-pay | Admitting: Medical

## 2020-01-03 ENCOUNTER — Ambulatory Visit (INDEPENDENT_AMBULATORY_CARE_PROVIDER_SITE_OTHER): Payer: 59 | Admitting: Medical

## 2020-01-03 ENCOUNTER — Other Ambulatory Visit: Payer: Self-pay

## 2020-01-03 VITALS — BP 138/88 | HR 65 | Resp 16 | Ht 69.0 in | Wt 192.2 lb

## 2020-01-03 DIAGNOSIS — Z0000 Encounter for general adult medical examination without abnormal findings: Secondary | ICD-10-CM | POA: Diagnosis not present

## 2020-01-03 LAB — COMPREHENSIVE METABOLIC PANEL
ALT: 13 U/L (ref 0–53)
AST: 12 U/L (ref 0–37)
Albumin: 4.8 g/dL (ref 3.5–5.2)
Alkaline Phosphatase: 80 U/L (ref 39–117)
BUN: 9 mg/dL (ref 6–23)
CO2: 28 mEq/L (ref 19–32)
Calcium: 9.8 mg/dL (ref 8.4–10.5)
Chloride: 105 mEq/L (ref 96–112)
Creatinine, Ser: 0.96 mg/dL (ref 0.40–1.50)
GFR: 87.36 mL/min (ref >60.00)
Glucose, Bld: 91 mg/dL (ref 70–99)
Potassium: 3.4 mEq/L — ABNORMAL LOW (ref 3.5–5.1)
Sodium: 140 mEq/L (ref 135–145)
Total Bilirubin: 0.6 mg/dL (ref 0.2–1.2)
Total Protein: 7.3 g/dL (ref 6.0–8.3)

## 2020-01-03 LAB — CBC WITH DIFFERENTIAL/PLATELET
Basophils Absolute: 0 10*3/uL (ref 0.0–0.1)
Basophils Relative: 0.6 % (ref 0.0–3.0)
Eosinophils Absolute: 0.1 10*3/uL (ref 0.0–0.7)
Eosinophils Relative: 1.3 % (ref 0.0–5.0)
HCT: 47.1 % (ref 39.0–52.0)
Hemoglobin: 15.5 g/dL (ref 13.0–17.0)
Lymphocytes Relative: 36.7 % (ref 12.0–46.0)
Lymphs Abs: 2.6 10*3/uL (ref 0.7–4.0)
MCHC: 32.9 g/dL (ref 30.0–36.0)
MCV: 89.9 fl (ref 78.0–100.0)
Monocytes Absolute: 0.7 10*3/uL (ref 0.1–1.0)
Monocytes Relative: 10.1 % (ref 3.0–12.0)
Neutro Abs: 3.6 10*3/uL (ref 1.4–7.7)
Neutrophils Relative %: 51.3 % (ref 43.0–77.0)
Platelets: 285 10*3/uL (ref 150.0–400.0)
RBC: 5.23 Mil/uL (ref 4.22–5.81)
RDW: 14 % (ref 11.5–15.5)
WBC: 7.1 10*3/uL (ref 4.0–10.5)

## 2020-01-03 LAB — LIPID PANEL
Cholesterol: 227 mg/dL — ABNORMAL HIGH (ref 0–200)
HDL: 52.3 mg/dL (ref >39.00)
LDL Cholesterol: 161 mg/dL — ABNORMAL HIGH (ref 0–99)
NonHDL: 174.68
Total CHOL/HDL Ratio: 4
Triglycerides: 68 mg/dL (ref 0.0–149.0)
VLDL: 13.6 mg/dL (ref 0.0–40.0)

## 2020-01-03 NOTE — Addendum Note (Signed)
Addended by: Anabel Halon on: 01/03/2020 12:24 PM   Modules accepted: Orders

## 2020-01-03 NOTE — Patient Instructions (Addendum)
For you wellness exam today I have ordered cbc, cmp and lipid panel.  Vaccine appear up to date. Presently deciding not to get covid vaccine  Recommend exercise and healthy diet.  We will let you know lab results as they come in.  Follow up date appointment will be determined after lab review.    Slight bp elevated today. Recommend check bp at home 2-3 times a week. If bp 140/90 or higher let us know. Majority of time in past bp better.   Cuidados preventivos en los hombres de 21 a 28 aos de edad Preventive Care 50-61 Years Old, Male Los cuidados preventivos hacen referencia a las opciones en cuanto al estilo de vida y a las visitas al mdico, las cuales pueden promover la salud y Musician. Esto puede comprender lo siguiente:  Un examen fsico anual. Esto tambin se conoce como control de bienestar anual.  Exmenes dentales y oculares de [place] regular.  Vacunas.  Estudios para Engineer, building services.  Opciones saludables de estilo de vida, como seguir una dieta saludable, hacer ejercicio regularmente, no usar drogas ni productos que contengan nicotina y tabaco, y limitar el consumo de alcohol. Qu puedo esperar para mi visita de cuidado preventivo? Examen fsico El mdico controlar lo siguiente:  IT consultant y [place]. Estos datos se pueden usar para calcular el ndice de masa corporal (Teresita), una medicin que indica si usted tiene un peso saludable.  Frecuencia cardaca y presin arterial.  Piel para detectar manchas anormales. Asesoramiento El mdico puede hacerle preguntas sobre lo siguiente:  Consumo de tabaco, alcohol y drogas.  Bienestar emocional.  Bienestar en el hogar y sus relaciones personales.  Actividad sexual.  Hbitos de alimentacin.  Trabajo y [place] laboral. Sander Nephew vacunas necesito?  [place] antigripal  Se recomienda aplicarse esta vacuna todos los [place]. Vacuna contra el ttanos, la difteria y la tos ferina (Tdap)  Es posible que tenga  que aplicarse un refuerzo contra el ttanos y la difteria (DT) cada 10aos. Vacuna contra la varicela  Es posible que tenga que aplicrsela si an no la recibi. Vacuna contra el virus del papiloma humano (VPH)  Si el mdico se lo recomienda, Research scientist (physical sciences) tres dosis a lo largo de 6 meses. Vacuna contra el sarampin, la rubola y las paperas (SRP)  Tal vez tenga que aplicarse por lo menos una dosis de la [place]. Tambin es posible que necesite una segunda dosis. Vacuna antimeningoccica conjugada (MenACWY)  Se recomienda la aplicacin de una dosis si tiene [employer] 68 y los 21aos de [place], y es estudiante universitario de Psychologist, educational ao que vive en una residencia estudiantil, o si tiene una de varias afecciones mdicas. Podra tambin necesitar dosis de refuerzo. Vacuna antineumoccica conjugada (PCV13)  Puede necesitar esta vacuna si tiene determinadas enfermedades y no se vacun anteriormente. Mertie Haslem Jolly antineumoccica de polisacridos (PPSV23)  Quizs tenga que aplicarse una o dos dosis si fuma o si tiene determinadas afecciones. Vacuna contra la hepatitis A  Es posible que necesite esta vacuna si tiene ciertas afecciones o si viaja o trabaja en lugares en los que podra estar expuesto a la hepatitis A. Vacuna contra la hepatitis B  Es posible que necesite esta vacuna si tiene ciertas afecciones o si viaja o trabaja en lugares en los que podra estar expuesto a la hepatitis B. Vacuna antihaemophilus influenzae tipo B (Hib)  Es posible que necesite esta vacuna si tiene algunos factores de [place]. Puede recibir las vacunas en forma de dosis individuales o en forma de  dos o ms vacunas juntas en la misma inyeccin (vacunas combinadas). Hable con su mdico [employer] y beneficios de las vacunas combinadas. Qu pruebas necesito? Anlisis de [employer] de lpidos y colesterol. Estos se pueden verificar cada 5 aos, a partir de los 21 aos de [employer].  Anlisis de  hepatitisC.  Anlisis de hepatitisB. Pruebas de deteccin   Pruebas de deteccin de la diabetes. Esto se Set designer un control del azcar en la sangre (glucosa) despus de no haber comido durante un periodo de tiempo (ayuno).  Anlisis de enfermedades de transmisin sexual (ETS). Hable con su mdico [employer], las opciones de tratamiento y, si corresponde, la necesidad de Optometrist ms pruebas. Siga estas instrucciones en su casa: Comida y bebida   Siga una dieta que incluya frutas y verduras frescas, cereales integrales, protenas magras y productos lcteos descremados.  Tome los suplementos vitamnicos y [employer] se lo haya indicado el mdico.  No beba alcohol si el mdico se lo prohbe.  Si bebe alcohol: ? Limite la cantidad que consume de 0 a 2 medidas por da. ? Est atento a la cantidad de alcohol que hay en las bebidas que toma. En los [place], una medida equivale a una botella de cerveza de 12oz (320ml), un vaso de vino de 5oz (179ml) o un vaso de una bebida alcohlica de alta graduacin de 1oz (48ml). Estilo de [employer] y las encas a diario.  Mantngase activo. Haga al menos 59minutos de ejercicio 5o ms [employer].  No consuma ningn producto que contenga nicotina o tabaco, como cigarrillos, cigarrillos electrnicos y tabaco de Higher education careers adviser. Si necesita ayuda para dejar de fumar, consulte al mdico.  Si es sexualmente activo, practique sexo seguro. Use un condn u otra forma de proteccin para prevenir las ITS (infecciones de transmisin sexual). Cundo volver?  Acuda al mdico una vez al ao para una visita de control.  Pregntele al mdico con qu frecuencia debe realizarse un control de la vista y los dientes.  Mantenga su esquema de vacunacin al da. Esta informacin no tiene Marine scientist el consejo del mdico. Asegrese de hacerle al mdico cualquier pregunta que tenga. Document  Revised: 08/12/2018 Document Reviewed: 08/12/2018 Elsevier Patient Education  [place].

## 2020-01-03 NOTE — Progress Notes (Signed)
Subjective:    Patient ID: Jaquise Canario, male    DOB: [birth year], 39 y.o.   MRN: [id number]  HPI  Pt in for cpe.  He is fasting.  He does not have plans to get covid vaccine.  Pt has been eating healthy, walks little bit daily. He smokes about one cigarette a week. At most 1-2 beers a week.    Pt bp is slight high today. Usually it is controlled.    Review of Systems  Constitutional: Negative for chills, fatigue and fever.  HENT: Negative for congestion.   Respiratory: Negative for cough, chest tightness, shortness of breath and wheezing.   Cardiovascular: Negative for chest pain and palpitations.  Gastrointestinal: Negative for abdominal pain.  Genitourinary: Negative for dysuria.  Musculoskeletal: Negative for back pain.  Skin: Negative for rash.  Neurological: Negative for dizziness and headaches.  Hematological: Negative for adenopathy. Does not bruise/bleed easily.  Psychiatric/Behavioral: Negative for behavioral problems and confusion.   Past Medical History:  Diagnosis Date  . Hypertension   . Rheumatic fever    as child     Social History   Socioeconomic History  . Marital status: Single    Spouse name: Not on file  . Number of children: Not on file  . Years of education: Not on file  . Highest education level: Not on file  Occupational History  . Not on file  Tobacco Use  . Smoking status: Never Smoker  . Smokeless tobacco: Never Used  Substance and Sexual Activity  . Alcohol use: Not Currently  . Drug use: Not on file  . Sexual activity: Not on file  Other Topics Concern  . Not on file  Social History Narrative  . Not on file   Social Determinants of Health   Financial Resource Strain:   . Difficulty of Paying Living Expenses:   Food Insecurity:   . Worried About Charity fundraiser in the Last Year:   . Arboriculturist in the Last Year:   Transportation Needs:   . Film/video editor (Medical):   Marland Kitchen Lack of  Transportation (Non-Medical):   Physical Activity:   . Days of Exercise per Week:   . Minutes of Exercise per Session:   Stress:   . Feeling of Stress :   Social Connections:   . Frequency of Communication with Friends and Family:   . Frequency of Social Gatherings with Friends and Family:   . Attends Religious Services:   . Active Member of Clubs or Organizations:   . Attends Archivist Meetings:   Marland Kitchen Marital Status:   Intimate Partner Violence:   . Fear of Current or Ex-Partner:   . Emotionally Abused:   Marland Kitchen Physically Abused:   . Sexually Abused:     Past Surgical History:  Procedure Laterality Date  . I & D EXTREMITY Left 08/06/2018   Procedure: IRRIGATION AND DEBRIDEMENT EXTREMITY;  Surgeon: Nicholes Stairs, MD;  Location: [facility];  Service: Orthopedics;  Laterality: Left;  . TIBIA IM NAIL INSERTION Left 08/06/2018   Procedure: INTRAMEDULLARY (IM) NAIL TIBIAL;  Surgeon: Nicholes Stairs, MD;  Location: [facility];  Service: Orthopedics;  Laterality: Left;    No family history on file.  No Known Allergies  Current Outpatient Medications on File Prior to Visit  Medication Sig Dispense Refill  . amLODipine (NORVASC) 5 MG tablet TAKE 1 TABLET (5 MG TOTAL) BY MOUTH DAILY. 90 tablet 0  . clonazePAM (KLONOPIN)  0.5 MG tablet TAKE 1 TABLET (0.5 MG TOTAL) BY MOUTH 2 (TWO) TIMES DAILY AS NEEDED FOR ANXIETY. 20 tablet 2  . hydrOXYzine (ATARAX/VISTARIL) 25 MG tablet 1 tab po q hs prn insomnia (Patient not taking: Reported on 12/19/2019) 30 tablet 0  . lisinopril (ZESTRIL) 40 MG tablet TAKE 1 TABLET (40 MG TOTAL) BY MOUTH DAILY. 90 tablet 3  . methocarbamol (ROBAXIN) 500 MG tablet Take 1 tablet (500 mg total) by mouth every 8 (eight) hours as needed for muscle spasms. 30 tablet 0  . metoCLOPramide (REGLAN) 5 MG tablet Take 1-2 tablets (5-10 mg total) by mouth every 8 (eight) hours as needed for nausea (if ondansetron (ZOFRAN) ineffective.). (Patient not taking: Reported on  12/19/2019)    . sertraline (ZOLOFT) 25 MG tablet Take 1 tablet (25 mg total) by mouth daily. Lowering dose (Patient not taking: Reported on 12/19/2019) 30 tablet 3  . sildenafil (VIAGRA) 50 MG tablet 1-2 tab po 1 hour prior to sex 10 tablet 0  . terbinafine (LAMISIL) 250 MG tablet Take 1 tablet (250 mg total) by mouth daily. (Patient not taking: Reported on 12/19/2019) 28 tablet 2   No current facility-administered medications on file prior to visit.    BP (!) 140/92 (BP Location: Right Arm, Patient Position: Sitting, Cuff Size: Normal)   Pulse 65   Resp 16   Ht 5\' 9"  (1.753 m)   Wt 192 lb 3.2 oz (87.2 kg)   SpO2 100%   BMI 28.38 kg/m       Objective:   Physical Exam  General Mental Status- Alert. General Appearance- Not in acute distress.   Skin General: Color- Normal Color. Moisture- Normal Moisture.  Neck Carotid Arteries- Normal color. Moisture- Normal Moisture. No carotid bruits. No JVD.  Chest and Lung Exam Auscultation: Breath Sounds:-Normal.  Cardiovascular Auscultation:Rythm- Regular. Murmurs & Other Heart Sounds:Auscultation of the heart reveals- No Murmurs.  Abdomen Inspection:-Inspeection Normal. Palpation/Percussion:Note:No mass. Palpation and Percussion of the abdomen reveal- Non Tender, Non Distended + BS, no rebound or guarding.    Neurologic Cranial Nerve exam:- CN III-XII intact(No nystagmus), symmetric smile. Strength:- 5/5 equal and symmetric strength both upper and lower extremities.      Assessment & Plan:  For you wellness exam today I have ordered cbc, cmp and lipid panel.  Vaccine appear up to date. Presently deciding not to get covid vaccine  Recommend exercise and healthy diet.  We will let you know lab results as they come in.  Follow up date appointment will be determined after lab review.

## 2020-01-16 DIAGNOSIS — Z634 Disappearance and death of family member: Secondary | ICD-10-CM | POA: Diagnosis not present

## 2020-01-16 DIAGNOSIS — F332 Major depressive disorder, recurrent severe without psychotic features: Secondary | ICD-10-CM | POA: Diagnosis not present

## 2020-01-19 DIAGNOSIS — F332 Major depressive disorder, recurrent severe without psychotic features: Secondary | ICD-10-CM | POA: Diagnosis not present

## 2020-01-19 DIAGNOSIS — Z634 Disappearance and death of family member: Secondary | ICD-10-CM | POA: Diagnosis not present

## 2020-01-23 DIAGNOSIS — F332 Major depressive disorder, recurrent severe without psychotic features: Secondary | ICD-10-CM | POA: Diagnosis not present

## 2020-01-23 DIAGNOSIS — Z634 Disappearance and death of family member: Secondary | ICD-10-CM | POA: Diagnosis not present

## 2020-01-26 DIAGNOSIS — Z634 Disappearance and death of family member: Secondary | ICD-10-CM | POA: Diagnosis not present

## 2020-01-26 DIAGNOSIS — F332 Major depressive disorder, recurrent severe without psychotic features: Secondary | ICD-10-CM | POA: Diagnosis not present

## 2020-02-02 DIAGNOSIS — F332 Major depressive disorder, recurrent severe without psychotic features: Secondary | ICD-10-CM | POA: Diagnosis not present

## 2020-02-02 DIAGNOSIS — Z634 Disappearance and death of family member: Secondary | ICD-10-CM | POA: Diagnosis not present

## 2020-02-09 DIAGNOSIS — Z634 Disappearance and death of family member: Secondary | ICD-10-CM | POA: Diagnosis not present

## 2020-02-09 DIAGNOSIS — F332 Major depressive disorder, recurrent severe without psychotic features: Secondary | ICD-10-CM | POA: Diagnosis not present

## 2020-03-05 DIAGNOSIS — F332 Major depressive disorder, recurrent severe without psychotic features: Secondary | ICD-10-CM | POA: Diagnosis not present

## 2020-03-05 DIAGNOSIS — Z634 Disappearance and death of family member: Secondary | ICD-10-CM | POA: Diagnosis not present

## 2020-03-18 ENCOUNTER — Other Ambulatory Visit: Payer: Self-pay | Admitting: Medical

## 2020-04-09 ENCOUNTER — Encounter: Payer: Self-pay | Admitting: Family Medicine

## 2020-04-09 ENCOUNTER — Ambulatory Visit (INDEPENDENT_AMBULATORY_CARE_PROVIDER_SITE_OTHER): Payer: 59 | Admitting: Family Medicine

## 2020-04-09 ENCOUNTER — Other Ambulatory Visit: Payer: Self-pay

## 2020-04-09 ENCOUNTER — Ambulatory Visit (INDEPENDENT_AMBULATORY_CARE_PROVIDER_SITE_OTHER): Payer: 59

## 2020-04-09 DIAGNOSIS — M79605 Pain in left leg: Secondary | ICD-10-CM

## 2020-04-09 MED ORDER — NABUMETONE 500 MG PO TABS: 500.0000 mg | ORAL_TABLET | Freq: Two times a day (BID) | ORAL | 3 refills | Status: DC | PRN

## 2020-04-09 MED ORDER — DICLOFENAC SODIUM 1 % EX GEL: 4.0000 g | Freq: Four times a day (QID) | CUTANEOUS | 6 refills | Status: DC | PRN
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
## 2020-04-24 DIAGNOSIS — E291 Testicular hypofunction: Secondary | ICD-10-CM | POA: Diagnosis not present

## 2020-05-02 DIAGNOSIS — Z8679 Personal history of other diseases of the circulatory system: Secondary | ICD-10-CM | POA: Diagnosis not present

## 2020-05-02 DIAGNOSIS — M79606 Pain in leg, unspecified: Secondary | ICD-10-CM | POA: Diagnosis not present

## 2020-05-02 DIAGNOSIS — F419 Anxiety disorder, unspecified: Secondary | ICD-10-CM | POA: Diagnosis not present

## 2020-05-02 DIAGNOSIS — Z6830 Body mass index (BMI) 30.0-30.9, adult: Secondary | ICD-10-CM | POA: Diagnosis not present

## 2020-05-02 DIAGNOSIS — E559 Vitamin D deficiency, unspecified: Secondary | ICD-10-CM | POA: Diagnosis not present

## 2020-05-02 DIAGNOSIS — M549 Dorsalgia, unspecified: Secondary | ICD-10-CM | POA: Diagnosis not present

## 2020-05-02 DIAGNOSIS — I10 Essential (primary) hypertension: Secondary | ICD-10-CM | POA: Diagnosis not present

## 2020-05-02 DIAGNOSIS — F329 Major depressive disorder, single episode, unspecified: Secondary | ICD-10-CM | POA: Diagnosis not present

## 2020-05-02 DIAGNOSIS — E291 Testicular hypofunction: Secondary | ICD-10-CM | POA: Diagnosis not present

## 2020-05-16 ENCOUNTER — Ambulatory Visit: Payer: 59 | Admitting: Orthopaedic Surgery

## 2020-05-16 ENCOUNTER — Encounter: Payer: Self-pay | Admitting: Orthopaedic Surgery

## 2020-05-16 VITALS — Ht 69.0 in | Wt 211.0 lb

## 2020-05-16 DIAGNOSIS — S82252B Displaced comminuted fracture of shaft of left tibia, initial encounter for open fracture type I or II: Secondary | ICD-10-CM

## 2020-05-16 NOTE — Progress Notes (Signed)
Office Visit Note   Patient: Joshua Hansen           Date of Birth: [birth year]           MRN: [id number] Visit Date: 05/16/2020              Requested by: [name], PA-C [address] [place],  [place] [address] PCP: [name], PA-C   Assessment & Plan: Visit Diagnoses:  1. Type I or II open displaced comminuted fracture of shaft of left tibia, initial encounter     Plan: Given the presentation I feel that he likely has residual chronic pain due to the severity of the injury and the soft tissue injury that it caused.  I am not getting the sense that the hardware is actually irritating him but rather it is more of a psychological thing that he knows that he has metal in his body.  Explained to him that it is normal to have some residual pain around the open fracture site because of the amount of energy and trauma that it causes.  He will think about this with his wife and consider his options.  I explained that I am more than happy to remove all the hardware but that it may not relieve any of his pain. Total face to face encounter time was greater than 25 minutes and over half of this time was spent in counseling and/or coordination of care.   Follow-Up Instructions: Return if symptoms worsen or fail to improve.   Orders:  No orders of the defined types were placed in this encounter.  No orders of the defined types were placed in this encounter.     Procedures: No procedures performed   Clinical Data: No additional findings.   Subjective: Chief Complaint  Patient presents with  . Left Leg - Pain    Patient is a 39 year old gentleman who presents with his wife who is acting as his language interpreter today.  He is here for evaluation of pain in his left leg mainly over the fracture site.  He had an open tib-fib in January 2020 that was treated with an IM nail by Dr.Rogers.  He went on to union of the tibia fracture.  He states that  he still has occasional pain that is severe at times mainly at the level of the fracture site.  He does not endorse any constant or significant pain around the ankle or knee.  He does not endorse any painful hardware around the screw heads.  Denies any constitutional symptoms or any subsequent injuries after the surgery.  He is a referral from Dr. Junius Roads for consultation for hardware removal.   Review of Systems  Constitutional: Negative.   All other systems reviewed and are negative.    Objective: Vital Signs: Ht 5\' 9"  (1.753 m)   Wt 211 lb (95.7 kg)   BMI 31.16 kg/m   Physical Exam Vitals and nursing note reviewed.  Constitutional:      Appearance: He is well-developed.  HENT:     Head: Normocephalic and atraumatic.  Eyes:     Pupils: Pupils are equal, round, and reactive to light.  Pulmonary:     Effort: Pulmonary effort is normal.  Abdominal:     Palpations: Abdomen is soft.  Musculoskeletal:        General: Normal range of motion.     Cervical back: Neck supple.  Skin:    General: Skin is warm.  Neurological:     Mental Status: He is alert and oriented to person, place, and time.  Psychiatric:        Behavior: Behavior normal.        Thought Content: Thought content normal.        Judgment: Judgment normal.     Ortho Exam Left lower leg shows fully healed traumatic and surgical scars.  He has no knee joint effusion.  Ankle is mildly swollen.  There is no signs of infection.  I am not able to easily palpate the screw heads and they are not overly symptomatic.  He has decent range of motion of the knee and the ankle. Specialty Comments:  No specialty comments available.  Imaging: No results found.   PMFS History: Patient Active Problem List   Diagnosis Date Noted  . Open fracture of left tibia 08/06/2018   Past Medical History:  Diagnosis Date  . Hypertension   . Rheumatic fever    as child    History reviewed. No pertinent family history.  Past  Surgical History:  Procedure Laterality Date  . I & D EXTREMITY Left 08/06/2018   Procedure: IRRIGATION AND DEBRIDEMENT EXTREMITY;  Surgeon: Nicholes Stairs, MD;  Location: [facility];  Service: Orthopedics;  Laterality: Left;  . TIBIA IM NAIL INSERTION Left 08/06/2018   Procedure: INTRAMEDULLARY (IM) NAIL TIBIAL;  Surgeon: Nicholes Stairs, MD;  Location: [facility];  Service: Orthopedics;  Laterality: Left;   Social History   Occupational History  . Not on file  Tobacco Use  . Smoking status: Never Smoker  . Smokeless tobacco: Never Used  Substance and Sexual Activity  . Alcohol use: Not Currently  . Drug use: Not on file  . Sexual activity: Not on file

## 2020-05-17 ENCOUNTER — Other Ambulatory Visit (HOSPITAL_BASED_OUTPATIENT_CLINIC_OR_DEPARTMENT_OTHER): Payer: Self-pay | Admitting: Physician Assistant

## 2020-06-14 ENCOUNTER — Encounter: Payer: Self-pay | Admitting: Medical

## 2020-06-15 ENCOUNTER — Telehealth: Payer: Self-pay | Admitting: Medical

## 2020-06-15 ENCOUNTER — Other Ambulatory Visit: Payer: Self-pay | Admitting: Medical

## 2020-06-15 MED ORDER — SILDENAFIL CITRATE 50 MG PO TABS: 50.0000 mg | ORAL_TABLET | Freq: Every day | ORAL | 0 refills | Status: DC | PRN

## 2020-06-15 NOTE — Telephone Encounter (Signed)
Rx viagra sent to pt pharmacy. 

## 2020-07-22 ENCOUNTER — Other Ambulatory Visit: Payer: Self-pay | Admitting: Medical

## 2020-09-16 ENCOUNTER — Ambulatory Visit: Payer: 59 | Admitting: Medical

## 2020-09-27 IMAGING — XA Imaging study
1 series · 1 of 1 positions shown · non-contrast
Comparison: none

CLINICAL DATA: Chronic low back pain. Right S1 radiculopathy.
Displacement of the L5-S1 lumbar disc.

[Series 1: ortho standard · 1 of 1 slices shown]
[im 1/1]
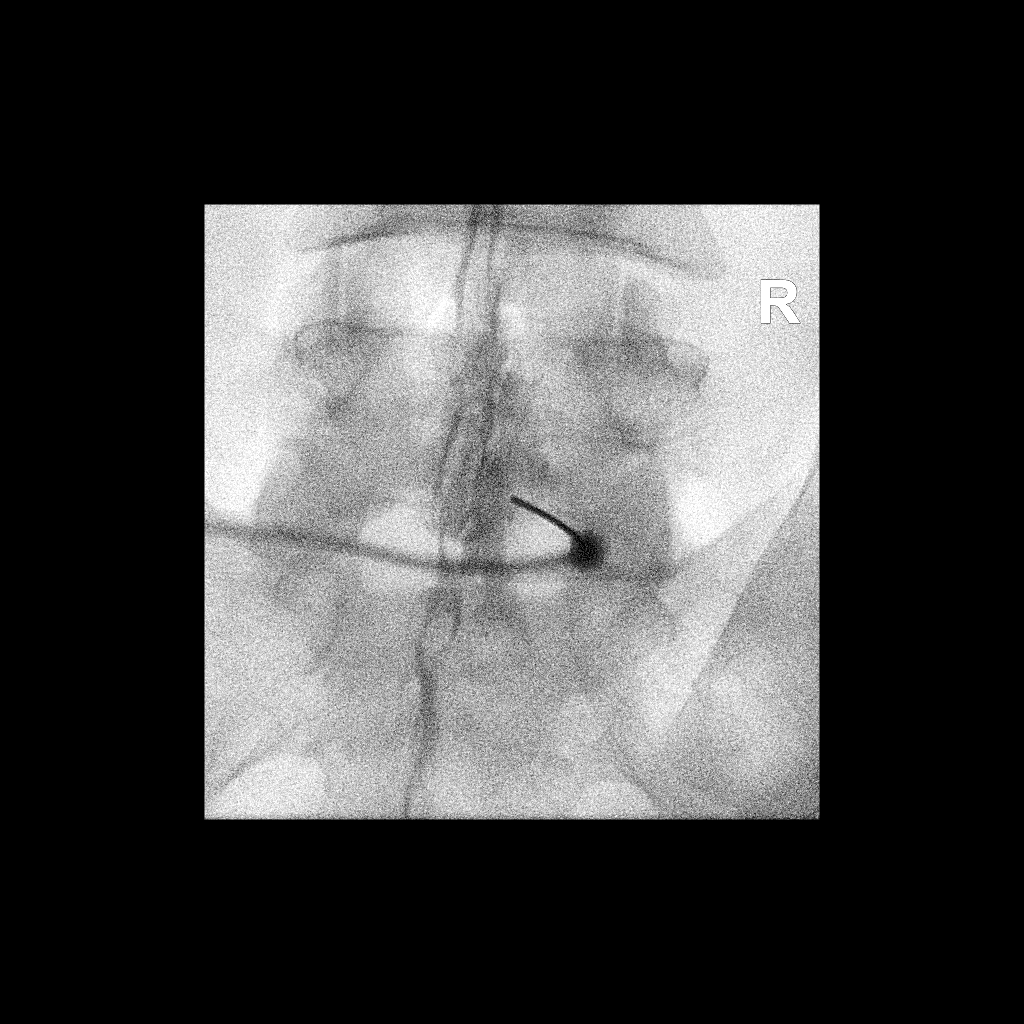

[1 of 1 positions shown; findings below may reference images not displayed]

FLUOROSCOPY TIME:  Radiation Exposure Index (as provided by the
fluoroscopic device): 4.93 uGy*m2

PROCEDURE:
The procedure, risks, benefits, and alternatives were explained to
the patient. Questions regarding the procedure were encouraged and
answered. The patient understands and consents to the procedure.

LUMBAR EPIDURAL INJECTION:

An interlaminar approach was performed on right at L5-S1. The
overlying skin was cleansed and anesthetized. A 20 gauge epidural
needle was advanced using loss-of-resistance technique.

DIAGNOSTIC EPIDURAL INJECTION:

Injection of Isovue-M 200 shows a good epidural pattern with spread
above and below the level of needle placement, primarily on the
right no vascular opacification is seen.

THERAPEUTIC EPIDURAL INJECTION:

120 mg of Depo-Medrol mixed with 3 mL 1% lidocaine were instilled.
The procedure was well-tolerated, and the patient was discharged
thirty minutes following the injection in good condition.

COMPLICATIONS:
None
IMPRESSION: Technically successful epidural injection on the right L5-S1 # 1

## 2020-10-03 ENCOUNTER — Other Ambulatory Visit: Payer: Self-pay | Admitting: Medical

## 2020-10-03 NOTE — Telephone Encounter (Signed)
  RX requested-  Clonazepam  Last refilled- 11/22/19 UDS- 06/02/19 Contract- 06/02/19 Last apt-  01/03/20 (physical ) Next apt - none

## 2020-10-04 ENCOUNTER — Other Ambulatory Visit: Payer: Self-pay | Admitting: Medical

## 2020-10-04 NOTE — Telephone Encounter (Signed)
Gave 14 tab refill. He needs to make follow up appt. Update contract and give uds. So can give throughout the year when needed/refills.

## 2020-10-04 NOTE — Telephone Encounter (Signed)
Mychart message sent to pt to make appointment. -JMA

## 2020-10-20 ENCOUNTER — Emergency Department (HOSPITAL_BASED_OUTPATIENT_CLINIC_OR_DEPARTMENT_OTHER): Payer: 59

## 2020-10-20 ENCOUNTER — Other Ambulatory Visit: Payer: Self-pay

## 2020-10-20 ENCOUNTER — Emergency Department (HOSPITAL_BASED_OUTPATIENT_CLINIC_OR_DEPARTMENT_OTHER)
Admission: EM | Admit: 2020-10-20 | Discharge: 2020-10-21 | Disposition: A | Discharge status: Home / Self Care | Payer: 59 | Attending: Emergency Medicine | Admitting: Emergency Medicine

## 2020-10-20 DIAGNOSIS — I10 Essential (primary) hypertension: Secondary | ICD-10-CM | POA: Insufficient documentation

## 2020-10-20 DIAGNOSIS — R079 Chest pain, unspecified: Secondary | ICD-10-CM | POA: Diagnosis not present

## 2020-10-20 DIAGNOSIS — F419 Anxiety disorder, unspecified: Secondary | ICD-10-CM

## 2020-10-20 DIAGNOSIS — R42 Dizziness and giddiness: Secondary | ICD-10-CM | POA: Insufficient documentation

## 2020-10-20 DIAGNOSIS — R0789 Other chest pain: Secondary | ICD-10-CM | POA: Diagnosis not present

## 2020-10-20 LAB — CBC WITH DIFFERENTIAL/PLATELET
Abs Immature Granulocytes: 0.09 10*3/uL — ABNORMAL HIGH (ref 0.00–0.07)
Basophils Absolute: 0.1 10*3/uL (ref 0.0–0.1)
Basophils Relative: 1 %
Eosinophils Absolute: 0 10*3/uL (ref 0.0–0.5)
Eosinophils Relative: 0 %
HCT: 48.7 % (ref 39.0–52.0)
Hemoglobin: 16.1 g/dL (ref 13.0–17.0)
Immature Granulocytes: 1 %
Lymphocytes Relative: 37 %
Lymphs Abs: 3.5 10*3/uL (ref 0.7–4.0)
MCH: 29.8 pg (ref 26.0–34.0)
MCHC: 33.1 g/dL (ref 30.0–36.0)
MCV: 90.2 fL (ref 80.0–100.0)
Monocytes Absolute: 1 10*3/uL (ref 0.1–1.0)
Monocytes Relative: 10 %
Neutro Abs: 4.8 10*3/uL (ref 1.7–7.7)
Neutrophils Relative %: 51 %
Platelets: 307 10*3/uL (ref 150–400)
RBC: 5.4 MIL/uL (ref 4.22–5.81)
RDW: 13.5 % (ref 11.5–15.5)
WBC: 9.6 10*3/uL (ref 4.0–10.5)
nRBC: 0 % (ref 0.0–0.2)

## 2020-10-20 LAB — MAGNESIUM: Magnesium: 2.3 mg/dL (ref 1.7–2.4)

## 2020-10-20 LAB — TROPONIN I (HIGH SENSITIVITY): Troponin I (High Sensitivity): 4 ng/L (ref <18)

## 2020-10-20 LAB — BASIC METABOLIC PANEL
Anion gap: 10 (ref 5–15)
BUN: 6 mg/dL (ref 6–20)
CO2: 25 mmol/L (ref 22–32)
Calcium: 9 mg/dL (ref 8.9–10.3)
Chloride: 102 mmol/L (ref 98–111)
Creatinine, Ser: 0.9 mg/dL (ref 0.61–1.24)
GFR, Estimated: >60 mL/min (ref >60)
Glucose, Bld: 105 mg/dL — ABNORMAL HIGH (ref 70–99)
Potassium: 2.9 mmol/L — ABNORMAL LOW (ref 3.5–5.1)
Sodium: 137 mmol/L (ref 135–145)

## 2020-10-20 MED ORDER — ONDANSETRON HCL 4 MG/2ML IJ SOLN
4.0000 mg | Freq: Once | INTRAMUSCULAR | Status: AC
  Administered 2020-10-20: 4 mg via INTRAVENOUS
  Filled 2020-10-20: qty 2

## 2020-10-20 MED ORDER — MECLIZINE HCL 25 MG PO TABS
25.0000 mg | ORAL_TABLET | Freq: Once | ORAL | Status: AC
  Administered 2020-10-20: 25 mg via ORAL
  Filled 2020-10-20: qty 1

## 2020-10-20 NOTE — ED Triage Notes (Signed)
Pt reports seeing stars and not feeling right while driving. Non compliant with BP meds

## 2020-10-20 NOTE — ED Notes (Signed)
PA at bedside.

## 2020-10-20 NOTE — ED Provider Notes (Signed)
[facility] EMERGENCY DEPARTMENT Provider Note   CSN: [id number] Arrival date & time: 10/20/20  2110     History Chief Complaint  Patient presents with  . Hypertension    [facility] is a 40 y.o. male w PMHx HTN, rheumatic fever, presenting to the emergency department with multiple complaints.  He states he just left [employer] around 8 PM he was in his car when he began feeling room spinning dizziness with nausea.  At the same time he developed left-sided chest pain radiating into his left neck and arm, as well as a numbness sensation to the left side of his body, heartburn, dryness in his mouth.  He described the chest pain as a prickling sensation, also throbbing and tight.  Chest pain lasted for about 30 minutes and is resolving upon arrival to the ED.  He states currently he just feels the room spinning dizziness that is worse with movements of his head.  He has felt this in the past as well as the neck discomfort when he had high blood pressure.  He took his lisinopril and Norvasc today, just later than usual. He does endorse history of anxiety and was feeling anxious today when symptoms were occurring, however his symptoms today feel different than a typical panic attack and he is having more tightness in his chest than usual with the panic attack.  Denies shortness of breath, diaphoresis, headache, loss or double vision, abdominal pain.  No recent head injury.  No new lower extremity swelling.  The history is provided by the patient. A language interpreter was used.       Past Medical History:  Diagnosis Date  . Hypertension   . Rheumatic fever    as child    Patient Active Problem List   Diagnosis Date Noted  . Open fracture of left tibia 08/06/2018    Past Surgical History:  Procedure Laterality Date  . I & D EXTREMITY Left 08/06/2018   Procedure: IRRIGATION AND DEBRIDEMENT EXTREMITY;  Surgeon: Nicholes Stairs, MD;  Location: [facility];   Service: Orthopedics;  Laterality: Left;  . TIBIA IM NAIL INSERTION Left 08/06/2018   Procedure: INTRAMEDULLARY (IM) NAIL TIBIAL;  Surgeon: Nicholes Stairs, MD;  Location: [facility];  Service: Orthopedics;  Laterality: Left;       No family history on file.  Social History   Tobacco Use  . Smoking status: Never Smoker  . Smokeless tobacco: Never Used  Substance Use Topics  . Alcohol use: Not Currently    Home Medications Prior to Admission medications   Medication Sig Start Date End Date Taking? Authorizing Provider  amLODipine (NORVASC) 5 MG tablet TAKE 1 TABLET (5 MG TOTAL) BY MOUTH DAILY. 07/22/20   Saguier, Percell Miller, PA-C  clonazePAM (KLONOPIN) 0.5 MG tablet TAKE 1 TABLET (0.5 MG TOTAL) BY MOUTH 2 (TWO) TIMES DAILY AS NEEDED FOR ANXIETY. 10/04/20   Saguier, Percell Miller, PA-C  diclofenac Sodium (VOLTAREN) 1 % GEL Apply 4 g topically 4 (four) times daily as needed. 04/09/20   Hilts, Legrand Como, MD  lisinopril (ZESTRIL) 40 MG tablet TAKE 1 TABLET (40 MG TOTAL) BY MOUTH DAILY. 11/20/19   Saguier, Percell Miller, PA-C  methocarbamol (ROBAXIN) 500 MG tablet Take 1 tablet (500 mg total) by mouth every 8 (eight) hours as needed for muscle spasms. 12/19/19   Saguier, Percell Miller, PA-C  nabumetone (RELAFEN) 500 MG tablet Take 1 tablet (500 mg total) by mouth 2 (two) times daily as needed. 04/09/20   Hilts, Legrand Como,  MD  sildenafil (VIAGRA) 50 MG tablet Take 1 tablet (50 mg total) by mouth daily as needed for erectile dysfunction. 06/15/20   Saguier, Percell Miller, PA-C    Allergies    Patient has no known allergies.  Review of Systems   Review of Systems  Respiratory: Positive for chest tightness.   Cardiovascular: Positive for chest pain. Negative for leg swelling.  Gastrointestinal: Positive for nausea. Negative for abdominal pain.  Musculoskeletal: Positive for neck pain.  Neurological: Positive for dizziness and numbness.  All other systems reviewed and are negative.   Physical Exam Updated Vital Signs BP (!)  156/104   Pulse 66   Temp 97.6 F (36.4 C) (Oral)   Resp 15   SpO2 100%   Physical Exam Vitals and nursing note reviewed.  Constitutional:      General: He is not in acute distress.    Appearance: He is well-developed. He is not ill-appearing.  HENT:     Head: Normocephalic and atraumatic.  Eyes:     Conjunctiva/sclera: Conjunctivae normal.  Cardiovascular:     Rate and Rhythm: Normal rate and regular rhythm.  Pulmonary:     Effort: Pulmonary effort is normal. No respiratory distress.     Breath sounds: Normal breath sounds.  Chest:     Chest wall: No tenderness.  Abdominal:     General: Bowel sounds are normal.     Palpations: Abdomen is soft.     Tenderness: There is no abdominal tenderness.  Musculoskeletal:     Cervical back: Normal range of motion and neck supple. No tenderness.     Right lower leg: No edema.     Left lower leg: No edema.  Skin:    General: Skin is warm.  Neurological:     Mental Status: He is alert.     Comments: Mental Status:  Alert, oriented, thought content appropriate, able to give a coherent history. Speech fluent without evidence of aphasia. Able to follow 2 step commands without difficulty.  Cranial Nerves:  II:   pupils equal, round, reactive to light III,IV, VI: ptosis not present, extra-ocular motions intact bilaterally with reproducible 1-2 beat unilateral horizontal nystagmus with rightward gaze V,VII: smile symmetric, facial light touch sensation equal VIII: hearing grossly normal to voice  X: uvula elevates symmetrically  XI: bilateral shoulder shrug symmetric and strong XII: midline tongue extension without fassiculations Motor:  Normal tone. 5/5 strength in upper and lower extremities bilaterally including strong and equal grip strength and dorsiflexion/plantar flexion Sensory: grossly normal in all extremities.  Cerebellar: normal finger-to-nose with bilateral upper extremities Gait: normal gait and balance CV: distal pulses  palpable throughout    Psychiatric:        Behavior: Behavior normal.     ED Results / Procedures / Treatments   Labs (all labs ordered are listed, but only abnormal results are displayed) Labs Reviewed  CBC WITH DIFFERENTIAL/PLATELET - Abnormal; Notable for the following components:      Result Value   Abs Immature Granulocytes 0.09 (*)    All other components within normal limits  BASIC METABOLIC PANEL - Abnormal; Notable for the following components:   Potassium 2.9 (*)    Glucose, Bld 105 (*)    All other components within normal limits  MAGNESIUM  TROPONIN I (HIGH SENSITIVITY)    EKG EKG Interpretation  Date/Time:  Sunday October 20 2020 22:31:11 EDT Ventricular Rate:  64 PR Interval:    QRS Duration: 95 QT Interval:  408 QTC Calculation:  421 R Axis:   59 Text Interpretation: Sinus rhythm Confirmed by Quintella Reichert [id number]) on 10/20/2020 10:39:24 PM   Radiology DG Chest 2 View  Result Date: 10/20/2020 CLINICAL DATA:  Chest pain EXAM: CHEST - 2 VIEW COMPARISON:  08/06/2018 FINDINGS: The heart size and mediastinal contours are within normal limits. Both lungs are clear. The visualized skeletal structures are unremarkable. IMPRESSION: Normal study. Electronically Signed   By: Rolm Baptise M.D.   On: 10/20/2020 23:24    Procedures Procedures   Medications Ordered in ED Medications  meclizine (ANTIVERT) tablet 25 mg (25 mg Oral Given 10/20/20 2354)  ondansetron (ZOFRAN) injection 4 mg (4 mg Intravenous Given 10/20/20 2328)    ED Course  I have reviewed the triage vital signs and the nursing notes.  Pertinent labs & imaging results that were available during my care of the patient were reviewed by me and considered in my medical decision making (see chart for details).    MDM Rules/Calculators/A&P                          Patient is a 40 year old male with history of hypertension and rheumatic fever, presenting for evaluation of positional room spinning  dizziness with nausea, chest pain radiating to left neck and ar.  Symptoms began around 8 PM this evening, chest pain lasted about 30 minutes.  Currently is complaining of positional room spinning dizziness with nausea.  He has no focal neuro deficits however 1-2 beat horizontal nystagmus is noted with rightward gaze only and is reproducible.  No recent head injury.  Does report history of similar dizziness with high blood pressure in the past.  Initial blood work ordered includes metabolic panel, blood counts, troponin, EKG and chest x-ray.  Discussed with attending physician Dr. Leonette Monarch who will evaluate for recommendations regarding possible CT imaging and assume care to complete workup and determine disposition.   Final Clinical Impression(s) / ED Diagnoses Final diagnoses:  None    Rx / DC Orders ED Discharge Orders    None       Robinson, [place] N, PA-C 10/23/20 1408    Fatima Blank, MD 10/28/20 [phone]

## 2020-10-20 NOTE — ED Provider Notes (Incomplete)
[facility] Provider Note   CSN: [id number] Arrival date & time: 10/20/20  2110     History Chief Complaint  Patient presents with  . Hypertension    [facility] is a 40 y.o. male w PMHx HTN, rheumatic fever, presenting to the emergency department with multiple complaints.  He states he just left [employer] around 8 PM he was in his car when he began feeling room spinning dizziness with nausea.  At the same time he developed left-sided chest pain radiating into his left neck and arm, as well as a numbness sensation to the left side of his body, heartburn, dryness in his mouth.  He described the chest pain as a prickling sensation, also throbbing and tight.  Chest pain lasted for about 30 minutes and is resolving upon arrival to the ED.  He states currently he just feels the room spinning dizziness that is worse with movements of his head.  He has felt this in the past as well as the neck discomfort when he had high blood pressure.  He took his lisinopril and Norvasc today, just later than usual. He does endorse history of anxiety and was feeling anxious today when symptoms were occurring, however his symptoms today feel different than a typical panic attack and he is having more tightness in his chest than usual with the panic attack.  Denies shortness of breath, diaphoresis, headache, loss or double vision, abdominal pain.  No recent head injury.  No new lower extremity swelling.  The history is provided by the patient. A language interpreter was used.       Past Medical History:  Diagnosis Date  . Hypertension   . Rheumatic fever    as child    Patient Active Problem List   Diagnosis Date Noted  . Open fracture of left tibia 08/06/2018    Past Surgical History:  Procedure Laterality Date  . I & D EXTREMITY Left 08/06/2018   Procedure: IRRIGATION AND DEBRIDEMENT EXTREMITY;  Surgeon: Nicholes Stairs, MD;  Location: [facility];   Service: Orthopedics;  Laterality: Left;  . TIBIA IM NAIL INSERTION Left 08/06/2018   Procedure: INTRAMEDULLARY (IM) NAIL TIBIAL;  Surgeon: Nicholes Stairs, MD;  Location: [facility];  Service: Orthopedics;  Laterality: Left;       No family history on file.  Social History   Tobacco Use  . Smoking status: Never Smoker  . Smokeless tobacco: Never Used  Substance Use Topics  . Alcohol use: Not Currently    Home Medications Prior to Admission medications   Medication Sig Start Date End Date Taking? Authorizing Provider  amLODipine (NORVASC) 5 MG tablet TAKE 1 TABLET (5 MG TOTAL) BY MOUTH DAILY. 07/22/20   Saguier, Percell Miller, PA-C  clonazePAM (KLONOPIN) 0.5 MG tablet TAKE 1 TABLET (0.5 MG TOTAL) BY MOUTH 2 (TWO) TIMES DAILY AS NEEDED FOR ANXIETY. 10/04/20   Saguier, Percell Miller, PA-C  diclofenac Sodium (VOLTAREN) 1 % GEL Apply 4 g topically 4 (four) times daily as needed. 04/09/20   Hilts, Legrand Como, MD  lisinopril (ZESTRIL) 40 MG tablet TAKE 1 TABLET (40 MG TOTAL) BY MOUTH DAILY. 11/20/19   Saguier, Percell Miller, PA-C  methocarbamol (ROBAXIN) 500 MG tablet Take 1 tablet (500 mg total) by mouth every 8 (eight) hours as needed for muscle spasms. 12/19/19   Saguier, Percell Miller, PA-C  nabumetone (RELAFEN) 500 MG tablet Take 1 tablet (500 mg total) by mouth 2 (two) times daily as needed. 04/09/20   Hilts, Legrand Como,  MD  sildenafil (VIAGRA) 50 MG tablet Take 1 tablet (50 mg total) by mouth daily as needed for erectile dysfunction. 06/15/20   Saguier, Percell Miller, PA-C    Allergies    Patient has no known allergies.  Review of Systems   Review of Systems  Respiratory: Positive for chest tightness.   Cardiovascular: Positive for chest pain. Negative for leg swelling.  Gastrointestinal: Positive for nausea. Negative for abdominal pain.  Musculoskeletal: Positive for neck pain.  Neurological: Positive for dizziness and numbness.  All other systems reviewed and are negative.   Physical Exam Updated Vital Signs BP (!)  156/104   Pulse 66   Temp 97.6 F (36.4 C) (Oral)   Resp 15   SpO2 100%   Physical Exam Vitals and nursing note reviewed.  Constitutional:      General: He is not in acute distress.    Appearance: He is well-developed. He is not ill-appearing.  HENT:     Head: Normocephalic and atraumatic.  Eyes:     Conjunctiva/sclera: Conjunctivae normal.  Cardiovascular:     Rate and Rhythm: Normal rate and regular rhythm.  Pulmonary:     Effort: Pulmonary effort is normal. No respiratory distress.     Breath sounds: Normal breath sounds.  Chest:     Chest wall: No tenderness.  Abdominal:     General: Bowel sounds are normal.     Palpations: Abdomen is soft.     Tenderness: There is no abdominal tenderness.  Musculoskeletal:     Cervical back: Normal range of motion and neck supple. No tenderness.     Right lower leg: No edema.     Left lower leg: No edema.  Skin:    General: Skin is warm.  Neurological:     Mental Status: He is alert.     Comments: Mental Status:  Alert, oriented, thought content appropriate, able to give a coherent history. Speech fluent without evidence of aphasia. Able to follow 2 step commands without difficulty.  Cranial Nerves:  II:   pupils equal, round, reactive to light III,IV, VI: ptosis not present, extra-ocular motions intact bilaterally with reproducible 1-2 beat unilateral horizontal nystagmus with rightward gaze V,VII: smile symmetric, facial light touch sensation equal VIII: hearing grossly normal to voice  X: uvula elevates symmetrically  XI: bilateral shoulder shrug symmetric and strong XII: midline tongue extension without fassiculations Motor:  Normal tone. 5/5 strength in upper and lower extremities bilaterally including strong and equal grip strength and dorsiflexion/plantar flexion Sensory: grossly normal in all extremities.  Cerebellar: normal finger-to-nose with bilateral upper extremities Gait: normal gait and balance CV: distal pulses  palpable throughout    Psychiatric:        Behavior: Behavior normal.     ED Results / Procedures / Treatments   Labs (all labs ordered are listed, but only abnormal results are displayed) Labs Reviewed  CBC WITH DIFFERENTIAL/PLATELET - Abnormal; Notable for the following components:      Result Value   Abs Immature Granulocytes 0.09 (*)    All other components within normal limits  BASIC METABOLIC PANEL - Abnormal; Notable for the following components:   Potassium 2.9 (*)    Glucose, Bld 105 (*)    All other components within normal limits  MAGNESIUM  TROPONIN I (HIGH SENSITIVITY)    EKG EKG Interpretation  Date/Time:  Sunday October 20 2020 22:31:11 EDT Ventricular Rate:  64 PR Interval:    QRS Duration: 95 QT Interval:  408 QTC Calculation:  421 R Axis:   59 Text Interpretation: Sinus rhythm Confirmed by Quintella Reichert [phone]) on 10/20/2020 10:39:24 PM   Radiology DG Chest 2 View  Result Date: 10/20/2020 CLINICAL DATA:  Chest pain EXAM: CHEST - 2 VIEW COMPARISON:  08/06/2018 FINDINGS: The heart size and mediastinal contours are within normal limits. Both lungs are clear. The visualized skeletal structures are unremarkable. IMPRESSION: Normal study. Electronically Signed   By: Rolm Baptise M.D.   On: 10/20/2020 23:24    Procedures Procedures {Remember to document critical care time when appropriate:1}  Medications Ordered in ED Medications  meclizine (ANTIVERT) tablet 25 mg (25 mg Oral Given 10/20/20 2354)  ondansetron (ZOFRAN) injection 4 mg (4 mg Intravenous Given 10/20/20 2328)    ED Course  I have reviewed the triage vital signs and the nursing notes.  Pertinent labs & imaging results that were available during my care of the patient were reviewed by me and considered in my medical decision making (see chart for details).    MDM Rules/Calculators/A&P                          Patient is a 40 year old male with history of hypertension and rheumatic fever,  presenting for evaluation of positional room spinning dizziness with nausea, chest pain radiating to left neck and ar.  Symptoms began around 8 PM this evening, chest pain lasted about 30 minutes.  Currently is complaining of positional room spinning dizziness with nausea.  He has no focal neuro deficits however 1-2 beat horizontal nystagmus is noted with rightward gaze only and is reproducible.  No recent head injury.  Does report history of similar dizziness with high blood pressure in the past.  Initial blood work ordered includes metabolic panel, blood counts, troponin, EKG and chest x-ray.  Discussed with attending physician Dr. Leonette Monarch who will evaluate for recommendations regarding possible CT imaging.   Final Clinical Impression(s) / ED Diagnoses Final diagnoses:  None    Rx / DC Orders ED Discharge Orders    None

## 2020-10-21 ENCOUNTER — Other Ambulatory Visit (HOSPITAL_BASED_OUTPATIENT_CLINIC_OR_DEPARTMENT_OTHER): Payer: Self-pay | Admitting: Emergency Medicine

## 2020-10-21 DIAGNOSIS — R079 Chest pain, unspecified: Secondary | ICD-10-CM | POA: Diagnosis not present

## 2020-10-21 DIAGNOSIS — R42 Dizziness and giddiness: Secondary | ICD-10-CM | POA: Diagnosis not present

## 2020-10-21 DIAGNOSIS — I10 Essential (primary) hypertension: Secondary | ICD-10-CM | POA: Diagnosis not present

## 2020-10-21 LAB — TROPONIN I (HIGH SENSITIVITY): Troponin I (High Sensitivity): 4 ng/L (ref <18)

## 2020-10-21 MED ORDER — MECLIZINE HCL 12.5 MG PO TABS: 12.5000 mg | ORAL_TABLET | Freq: Three times a day (TID) | ORAL | 0 refills | Status: DC | PRN

## 2020-10-21 MED ORDER — POTASSIUM CHLORIDE CRYS ER 20 MEQ PO TBCR: 20.0000 meq | EXTENDED_RELEASE_TABLET | Freq: Every day | ORAL | 0 refills | Status: DC

## 2020-10-21 NOTE — ED Provider Notes (Signed)
Attestation: Medical screening examination/treatment/procedure(s) were conducted as a shared visit with non-physician practitioner(s) and myself.  I personally evaluated the patient during the encounter.   Briefly, the patient is a 40 y.o. male with h/o anxiety and HTN, here for dizziness and chest pain.   Vitals:   10/20/20 2330 10/21/20 0030  BP: (!) 156/104 (!) 160/97  Pulse: 66 70  Resp: 15 16  Temp:    SpO2: 100% 100%    CONSTITUTIONAL:  well-appearing, NAD NEURO:  Alert and oriented x 3, no focal deficits. Unilateral nystagmus. HINT reassuring. EYES:  pupils equal and reactive ENT/NECK:  trachea midline, no JVD CARDIO:  reg rate, reg rhythm, well-perfused PULM:  None labored breathing GI/GU:  Abdomen non-distended MSK/SPINE:  No gross deformities, no edema SKIN:  no rash, atraumatic PSYCH:  Appropriate speech and behavior   EKG Interpretation  Date/Time:  "Sunday October 20 2020 22:31:11 EDT Ventricular Rate:  64 PR Interval:    QRS Duration: 95 QT Interval:  408 QTC Calculation: 421 R Axis:   59 Text Interpretation: Sinus rhythm Confirmed by Rees, Elizabeth ([id number]) on 10/20/2020 10:39:24 PM       Nonischemic EKG with a negative initial troponin. CBC without leukocytosis or anemia. Metabolic panel with mild hypokalemia without other significant electrolyte derangement or renal sufficiency On my evaluation the patient's pain had completely resolved without intervention. He had just received meclizine for vertigo.  Still had unilateral nystagmus but hints exam was reassuring for peripheral process.  Patient was able to ambulate without complication.  I have low suspicion for aortic dissection.  Doubt PE.  Doubt CVA.  Currently pending delta troponin.  Troponin negative.  The patient appears reasonably screened and/or stabilized for discharge and I doubt any other medical condition or other EMC requiring further screening, evaluation, or treatment in the ED at this  time prior to discharge. Safe for discharge with strict return precautions.  Disposition: Discharge  Condition: Good  I have discussed the results, Dx and Tx plan with the patient/family who expressed understanding and agree(s) with the plan. Discharge instructions discussed at length. The patient/family was given strict return precautions who verbalized understanding of the instructions. No further questions at time of discharge.    ED Discharge Orders         Ordered    meclizine (ANTIVERT) 12.5 MG tablet  3 times daily PRN        10/21/20 0133    potassium chloride SA (KLOR-CON) 20 MEQ tablet  Daily        03" /21/22 0133           Follow Up: [name], PA-C [place] [address] [phone]  Call  to schedule an appointment for close follow up        Fatima Blank, MD 10/21/20 [phone]

## 2020-10-25 ENCOUNTER — Ambulatory Visit: Payer: 59 | Admitting: Medical

## 2020-10-25 ENCOUNTER — Encounter: Payer: Self-pay | Admitting: Medical

## 2020-10-25 ENCOUNTER — Other Ambulatory Visit: Payer: Self-pay | Admitting: Medical

## 2020-10-25 ENCOUNTER — Other Ambulatory Visit: Payer: Self-pay

## 2020-10-25 VITALS — BP 127/76 | HR 80 | Resp 18 | Ht 69.0 in | Wt 201.4 lb

## 2020-10-25 DIAGNOSIS — F32A Depression, unspecified: Secondary | ICD-10-CM

## 2020-10-25 DIAGNOSIS — Z79899 Other long term (current) drug therapy: Secondary | ICD-10-CM | POA: Diagnosis not present

## 2020-10-25 DIAGNOSIS — I10 Essential (primary) hypertension: Secondary | ICD-10-CM | POA: Diagnosis not present

## 2020-10-25 DIAGNOSIS — F419 Anxiety disorder, unspecified: Secondary | ICD-10-CM

## 2020-10-25 DIAGNOSIS — R739 Hyperglycemia, unspecified: Secondary | ICD-10-CM | POA: Diagnosis not present

## 2020-10-25 DIAGNOSIS — G4700 Insomnia, unspecified: Secondary | ICD-10-CM | POA: Diagnosis not present

## 2020-10-25 DIAGNOSIS — E875 Hyperkalemia: Secondary | ICD-10-CM

## 2020-10-25 MED ORDER — CLONAZEPAM 0.5 MG PO TABS: 0.5000 mg | ORAL_TABLET | Freq: Two times a day (BID) | ORAL | 0 refills | Status: DC | PRN

## 2020-10-25 MED ORDER — BUPROPION HCL ER (XL) 150 MG PO TB24: 150.0000 mg | ORAL_TABLET | Freq: Every day | ORAL | 0 refills | Status: DC

## 2020-10-25 NOTE — Addendum Note (Signed)
Addended by: Kelle Darting A on: 10/25/2020 03:47 PM   Modules accepted: Orders

## 2020-10-25 NOTE — Patient Instructions (Signed)
History of anxiety and depression.  Will prescribe clonazepam today and add Wellbutrin.  We will have you fill out controlled med contract today and get UDS.  Counseled on plan if you have thoughts of harm to self or others that are persisting.  If so then be seen at [facility] long ED or call 911.  I think insomnia will be help with above medication.  Elevated sugar and low potassium on recent ED labs.  Will get CMP and A1c today.  History of hypertension with elevated blood pressure readings in the emergency department and yesterday at pharmacy.  However today blood pressure is very well controlled.  Continue amlodipine 5 mg and lisinopril 40 mg daily.  Check blood pressure daily at home.  If you note blood pressures trending above 140/90 then just the current regimen.  Update me in 1 week with her blood pressure readings.  If spiking above 423 systolic before then then please let me know.  Follow-up in 1 month or as needed.

## 2020-10-25 NOTE — Progress Notes (Signed)
Subjective:    Patient ID: Joshua Hansen, male    DOB: 02/24/1981, 40 y.o.   MRN: [id number]  HPI  Pt in for follow up.  Pt had recent low potassium in ED. Also had mild elevated sugar  Pt went to ED with some dizziness. His bp was elevated at the time.  Pt was given potassium in ED and given meclizine. When he wakes in morning very transient light headed sensation but resolves quickly. No current dizziness on exam. Troponin in ED was negative.  Ekg in ED nsr.  Pt bp is good presently. Pt states 165/111 yesterday. He was at pharmacy.   Pt not drinking any alcohol. Non smoker. At most on coca cola a day.  Pt is still on amlodipine 5 mg daily and lisinopril 40 mg daily. Day he went to ED he was on bp med.   Pt has hx of anxiety. Still has. Hx of daughter passing away in accident. Pt is still depressed. Pt went to counselor. Some marital problems.  Patient reports some transient thoughts of harm to self but does not do well on those thoughts.  No formal plan no present plan today.    Review of Systems  Constitutional: Negative for chills, fatigue and fever.  Respiratory: Negative for cough, chest tightness, shortness of breath and wheezing.   Cardiovascular: Negative for chest pain and palpitations.  Gastrointestinal: Negative for abdominal pain.  Musculoskeletal: Negative for back pain, myalgias and neck stiffness.  Skin: Negative for rash.  Neurological: Negative for dizziness, speech difficulty, weakness, numbness and headaches.  Hematological: Negative for adenopathy. Does not bruise/bleed easily.  Psychiatric/Behavioral: Negative for behavioral problems, hallucinations, self-injury and suicidal ideas. The patient is nervous/anxious.        See [id number]    Past Medical History:  Diagnosis Date  . Hypertension   . Rheumatic fever    as child     Social History   Socioeconomic History  . Marital status: Married    Spouse name: Not on file  . Number of  children: Not on file  . Years of education: Not on file  . Highest education level: Not on file  Occupational History  . Not on file  Tobacco Use  . Smoking status: Never Smoker  . Smokeless tobacco: Never Used  Substance and Sexual Activity  . Alcohol use: Not Currently  . Drug use: Not on file  . Sexual activity: Not on file  Other Topics Concern  . Not on file  Social History Narrative  . Not on file   Social Determinants of Health   Financial Resource Strain: Not on file  Food Insecurity: Not on file  Transportation Needs: Not on file  Physical Activity: Not on file  Stress: Not on file  Social Connections: Not on file  Intimate Partner Violence: Not on file    Past Surgical History:  Procedure Laterality Date  . I & D EXTREMITY Left 08/06/2018   Procedure: IRRIGATION AND DEBRIDEMENT EXTREMITY;  Surgeon: Nicholes Stairs, MD;  Location: [facility];  Service: Orthopedics;  Laterality: Left;  . TIBIA IM NAIL INSERTION Left 08/06/2018   Procedure: INTRAMEDULLARY (IM) NAIL TIBIAL;  Surgeon: Nicholes Stairs, MD;  Location: [facility];  Service: Orthopedics;  Laterality: Left;    No family history on file.  No Known Allergies  Current Outpatient Medications on File Prior to Visit  Medication Sig Dispense Refill  . amLODipine (NORVASC) 5 MG tablet TAKE 1 TABLET (5 MG  TOTAL) BY MOUTH DAILY. 90 tablet 0  . clonazePAM (KLONOPIN) 0.5 MG tablet TAKE 1 TABLET (0.5 MG TOTAL) BY MOUTH 2 (TWO) TIMES DAILY AS NEEDED FOR ANXIETY. 14 tablet 0  . diclofenac Sodium (VOLTAREN) 1 % GEL Apply 4 g topically 4 (four) times daily as needed. 500 g 6  . lisinopril (ZESTRIL) 40 MG tablet TAKE 1 TABLET (40 MG TOTAL) BY MOUTH DAILY. 90 tablet 3  . meclizine (ANTIVERT) 12.5 MG tablet Take 1 tablet (12.5 mg total) by mouth 3 (three) times daily as needed for dizziness. 30 tablet 0  . methocarbamol (ROBAXIN) 500 MG tablet Take 1 tablet (500 mg total) by mouth every 8 (eight) hours as needed for muscle  spasms. 30 tablet 0  . nabumetone (RELAFEN) 500 MG tablet Take 1 tablet (500 mg total) by mouth 2 (two) times daily as needed. 60 tablet 3  . potassium chloride SA (KLOR-CON) 20 MEQ tablet Take 1 tablet (20 mEq total) by mouth daily for 10 days. 10 tablet 0  . sildenafil (VIAGRA) 50 MG tablet Take 1 tablet (50 mg total) by mouth daily as needed for erectile dysfunction. 10 tablet 0   No current facility-administered medications on file prior to visit.    BP 127/76   Pulse 80   Resp 18   Ht 5\' 9"  (1.753 m)   Wt 201 lb 6.4 oz (91.4 kg)   SpO2 98%   BMI 29.74 kg/m       Objective:   Physical Exam  General Mental Status- Alert. General Appearance- Not in acute distress.   Skin General: Color- Normal Color. Moisture- Normal Moisture.  Neck Carotid Arteries- Normal color. Moisture- Normal Moisture. No carotid bruits. No JVD.  Chest and Lung Exam Auscultation: Breath Sounds:-Normal.  Cardiovascular Auscultation:Rythm- Regular. Murmurs & Other Heart Sounds:Auscultation of the heart reveals- No Murmurs.  Abdomen Inspection:-Inspeection Normal. Palpation/Percussion:Note:No mass. Palpation and Percussion of the abdomen reveal- Non Tender, Non Distended + BS, no rebound or guarding.    Neurologic Cranial Nerve exam:- CN III-XII intact(No nystagmus), symmetric smile. Strength:- 5/5 equal and symmetric strength both upper and lower extremities.      Assessment & Plan:  History of anxiety and depression.  Will prescribe clonazepam today and add Wellbutrin.  We will have you fill out controlled med contract today and get UDS.  Counseled on plan if you have thoughts of harm to self or others that are persisting.  If so then be seen at [facility] long ED or call 911.  I think insomnia will be help with above medication.  Elevated sugar and low potassium on recent ED labs.  Will get CMP and A1c today.  History of hypertension with elevated blood pressure readings in the emergency  department and yesterday at pharmacy.  However today blood pressure is very well controlled.  Continue amlodipine 5 mg and lisinopril 40 mg daily.  Check blood pressure daily at home.  If you note blood pressures trending above 140/90 then just the current regimen.  Update me in 1 week with her blood pressure readings.  If spiking above 354 systolic before then then please let me know.  Follow-up in 1 month or as needed.  [name], PA-C

## 2020-10-26 LAB — DRUG MONITORING, PANEL 8 WITH CONFIRMATION, URINE
6 Acetylmorphine: NEGATIVE ng/mL (ref <10)
Alcohol Metabolites: NEGATIVE ng/mL
Amphetamines: NEGATIVE ng/mL (ref <500)
Benzodiazepines: NEGATIVE ng/mL (ref <100)
Buprenorphine, Urine: NEGATIVE ng/mL (ref <5)
Cocaine Metabolite: NEGATIVE ng/mL (ref <150)
Creatinine: 104.7 mg/dL
MDMA: NEGATIVE ng/mL (ref <500)
Marijuana Metabolite: NEGATIVE ng/mL (ref <20)
Opiates: NEGATIVE ng/mL (ref <100)
Oxidant: NEGATIVE ug/mL
Oxycodone: NEGATIVE ng/mL (ref <100)
pH: 6.6 (ref 4.5–9.0)

## 2020-10-26 LAB — COMPREHENSIVE METABOLIC PANEL
AG Ratio: 1.7 (calc) (ref 1.0–2.5)
ALT: 17 U/L (ref 9–46)
AST: 14 U/L (ref 10–40)
Albumin: 4.4 g/dL (ref 3.6–5.1)
Alkaline phosphatase (APISO): 84 U/L (ref 36–130)
BUN: 12 mg/dL (ref 7–25)
CO2: 22 mmol/L (ref 20–32)
Calcium: 9.5 mg/dL (ref 8.6–10.3)
Chloride: 104 mmol/L (ref 98–110)
Creat: 1.01 mg/dL (ref 0.60–1.35)
Globulin: 2.6 g/dL (calc) (ref 1.9–3.7)
Glucose, Bld: 79 mg/dL (ref 65–99)
Potassium: 3.8 mmol/L (ref 3.5–5.3)
Sodium: 139 mmol/L (ref 135–146)
Total Bilirubin: 0.5 mg/dL (ref 0.2–1.2)
Total Protein: 7 g/dL (ref 6.1–8.1)

## 2020-10-26 LAB — DM TEMPLATE

## 2020-10-26 LAB — HEMOGLOBIN A1C
Hgb A1c MFr Bld: 5.3 % of total Hgb (ref <5.7)
Mean Plasma Glucose: 105 mg/dL
eAG (mmol/L): 5.8 mmol/L

## 2020-11-02 ENCOUNTER — Other Ambulatory Visit (HOSPITAL_BASED_OUTPATIENT_CLINIC_OR_DEPARTMENT_OTHER): Payer: Self-pay

## 2020-11-02 MED FILL — Clonazepam Tab 0.5 MG: ORAL | 30 days supply | Qty: 60 | Fill #0 | Status: AC

## 2020-11-02 MED FILL — Bupropion HCl Tab ER 24HR 150 MG: ORAL | 30 days supply | Qty: 30 | Fill #0 | Status: AC

## 2020-12-11 ENCOUNTER — Other Ambulatory Visit: Payer: Self-pay | Admitting: Medical

## 2020-12-12 ENCOUNTER — Other Ambulatory Visit (HOSPITAL_BASED_OUTPATIENT_CLINIC_OR_DEPARTMENT_OTHER): Payer: Self-pay

## 2020-12-12 MED ORDER — AMLODIPINE BESYLATE 5 MG PO TABS
ORAL_TABLET | Freq: Every day | ORAL | 0 refills | Status: DC
  Filled 2020-12-12: qty 90, 90d supply, fill #0

## 2020-12-12 MED ORDER — LISINOPRIL 40 MG PO TABS
ORAL_TABLET | Freq: Every day | ORAL | 3 refills | Status: DC
  Filled 2020-12-12: qty 90, 90d supply, fill #0
  Filled 2021-04-08: qty 90, 90d supply, fill #1
  Filled 2021-09-30: qty 90, 90d supply, fill #2

## 2020-12-12 MED ORDER — SILDENAFIL CITRATE 50 MG PO TABS
ORAL_TABLET | ORAL | 0 refills | Status: DC
  Filled 2020-12-12: qty 6, 30d supply, fill #0
  Filled 2021-01-02: qty 4, 20d supply, fill #1
  Filled 2021-02-19: qty 6, 30d supply, fill #1

## 2021-01-02 ENCOUNTER — Other Ambulatory Visit: Payer: Self-pay

## 2021-01-02 ENCOUNTER — Ambulatory Visit: Payer: 59 | Admitting: Medical

## 2021-01-02 ENCOUNTER — Other Ambulatory Visit (HOSPITAL_BASED_OUTPATIENT_CLINIC_OR_DEPARTMENT_OTHER): Payer: Self-pay

## 2021-01-02 VITALS — BP 126/72 | HR 70 | Resp 18 | Ht 69.0 in | Wt 205.2 lb

## 2021-01-02 DIAGNOSIS — W57XXXA Bitten or stung by nonvenomous insect and other nonvenomous arthropods, initial encounter: Secondary | ICD-10-CM

## 2021-01-02 DIAGNOSIS — S30861A Insect bite (nonvenomous) of abdominal wall, initial encounter: Secondary | ICD-10-CM | POA: Diagnosis not present

## 2021-01-02 DIAGNOSIS — F419 Anxiety disorder, unspecified: Secondary | ICD-10-CM

## 2021-01-02 DIAGNOSIS — L089 Local infection of the skin and subcutaneous tissue, unspecified: Secondary | ICD-10-CM

## 2021-01-02 DIAGNOSIS — I10 Essential (primary) hypertension: Secondary | ICD-10-CM

## 2021-01-02 DIAGNOSIS — F32A Depression, unspecified: Secondary | ICD-10-CM

## 2021-01-02 MED ORDER — DOXYCYCLINE HYCLATE 100 MG PO TABS
100.0000 mg | ORAL_TABLET | Freq: Two times a day (BID) | ORAL | 0 refills | Status: DC
  Filled 2021-01-02: qty 20, 10d supply, fill #0

## 2021-01-02 NOTE — Patient Instructions (Signed)
Recent tick bote under umbilical area.  Over the last week signs and symptoms that indicate probable skin infection.  Will prescribe doxycycline antibiotic.  Cover for Lyme and [facility] spotted fever.  Also we will go ahead and get tick bite antibody studies.   Hypertension well controlled with amlodipine and lisinopril.  Glad to hear depression/ anxiety controlled with Wellbutrin and clonazepam.  Follow-up in 10 to 14 days or as needed.

## 2021-01-02 NOTE — Progress Notes (Signed)
Subjective:    Patient ID: Joshua Hansen, male    DOB: [birth year], 40 y.o.   MRN: [id number]  HPI Pt in for bit by tick. He got bit 3 weeks ago. He thinks tick was on his body for one day only. He states about 1 week ago noted slight redness/hyperpigmentation to area and mild itching.   He described little bit of difficulty removing the tick.   No fever, no chills, no sweats and no bodyaches.  Pt has hx of htn. bp controlled.  Update me on depression and anxiety. Well controlled with wellbutrin and clonazepam.        Review of Systems  Constitutional: Negative for chills, fatigue and fever.  Respiratory: Negative for cough, chest tightness, shortness of breath and wheezing.   Cardiovascular: Negative for chest pain and palpitations.  Skin:       See hpi and exam.  Neurological: Negative for dizziness, weakness, numbness and headaches.  Hematological: Negative for adenopathy. Does not bruise/bleed easily.  Psychiatric/Behavioral: Negative for behavioral problems and decreased concentration.   Past Medical History:  Diagnosis Date  . Hypertension   . Rheumatic fever    as child     Social History   Socioeconomic History  . Marital status: Married    Spouse name: Not on file  . Number of children: Not on file  . Years of education: Not on file  . Highest education level: Not on file  Occupational History  . Not on file  Tobacco Use  . Smoking status: Never Smoker  . Smokeless tobacco: Never Used  Substance and Sexual Activity  . Alcohol use: Not Currently  . Drug use: Not on file  . Sexual activity: Not on file  Other Topics Concern  . Not on file  Social History Narrative  . Not on file   Social Determinants of Health   Financial Resource Strain: Not on file  Food Insecurity: Not on file  Transportation Needs: Not on file  Physical Activity: Not on file  Stress: Not on file  Social Connections: Not on file  Intimate Partner Violence:  Not on file    Past Surgical History:  Procedure Laterality Date  . I & D EXTREMITY Left 08/06/2018   Procedure: IRRIGATION AND DEBRIDEMENT EXTREMITY;  Surgeon: Nicholes Stairs, MD;  Location: [facility];  Service: Orthopedics;  Laterality: Left;  . TIBIA IM NAIL INSERTION Left 08/06/2018   Procedure: INTRAMEDULLARY (IM) NAIL TIBIAL;  Surgeon: Nicholes Stairs, MD;  Location: [facility];  Service: Orthopedics;  Laterality: Left;    No family history on file.  No Known Allergies  Current Outpatient Medications on File Prior to Visit  Medication Sig Dispense Refill  . amLODipine (NORVASC) 5 MG tablet TAKE 1 TABLET (5 MG TOTAL) BY MOUTH DAILY. 90 tablet 0  . buPROPion (WELLBUTRIN XL) 150 MG 24 hr tablet TAKE 1 TABLET BY MOUTH ONCE DAILY 30 tablet 0  . clomiPHENE (CLOMID) 50 MG tablet TAKE 1/2 TABLET BY MOUTH ONCE A DAY 30 DAYS 15 tablet 3  . clonazePAM (KLONOPIN) 0.5 MG tablet TAKE 1 TABLET BY MOUTH TWICE DAILY AS NEEDED FOR ANXIETY 60 tablet 0  . diclofenac Sodium (VOLTAREN) 1 % GEL Apply 4 g topically 4 (four) times daily as needed. 500 g 6  . lisinopril (ZESTRIL) 40 MG tablet TAKE 1 TABLET (40 MG TOTAL) BY MOUTH DAILY. 90 tablet 3  . meclizine (ANTIVERT) 12.5 MG tablet TAKE 1 TABLET BY MOUTH THREE TIMES  DAILY AS NEEDED FOR DIZZINESS 30 tablet 0  . methocarbamol (ROBAXIN) 500 MG tablet Take 1 tablet (500 mg total) by mouth every 8 (eight) hours as needed for muscle spasms. 30 tablet 0  . nabumetone (RELAFEN) 500 MG tablet Take 1 tablet (500 mg total) by mouth 2 (two) times daily as needed. 60 tablet 3  . potassium chloride SA (KLOR-CON) 20 MEQ tablet TAKE 1 TABLET BY MOUTH ONCE DAILY FOR 10 DAYS 10 tablet 0  . sildenafil (VIAGRA) 50 MG tablet TAKE 1 TABLET (50 MG TOTAL) BY MOUTH DAILY AS NEEDED FOR ERECTILE DYSFUNCTION. 10 tablet 0   No current facility-administered medications on file prior to visit.    BP 126/72   Pulse 70   Resp 18   Ht 5\' 9"  (1.753 m)   Wt 205 lb 3.2 oz (93.1 kg)    SpO2 99%   BMI 30.30 kg/m       Objective:   Physical Exam   General- No acute distress. Pleasant patient. Neck- Full range of motion, no jvd Lungs- Clear, even and unlabored. Heart- regular rate and rhythm. Neurologic- CNII- XII grossly intact.   Skin- area under umbilicus mid line that is hyperpigmented/mild pink in color. I don't see any portion of tick left. Mild indurated on palpation.     Assessment & Plan:  Recent tick bote under umbilical area.  Over the last week signs and symptoms that indicate probable skin infection.  Will prescribe doxycycline antibiotic.  Cover for Lyme and [facility] spotted fever.  Also we will go ahead and get tick bite antibody studies.   Hypertension well controlled with amlodipine and lisinopril.  Glad to hear depression/ anxiety controlled with Wellbutrin and clonazepam.  Follow-up in 10 to 14 days or as needed.  [name], PA-C

## 2021-01-02 NOTE — Addendum Note (Signed)
Addended by: Manuela Schwartz on: 01/02/2021 10:18 AM   Modules accepted: Orders

## 2021-01-03 LAB — B. BURGDORFI ANTIBODIES: B burgdorferi Ab IgG+IgM: <0.9 index

## 2021-01-03 LAB — ROCKY MTN SPOTTED FVR ABS PNL(IGG+IGM)
RMSF IgG: NOT DETECTED
RMSF IgM: NOT DETECTED

## 2021-01-16 ENCOUNTER — Ambulatory Visit: Payer: 59 | Admitting: Medical

## 2021-01-20 ENCOUNTER — Other Ambulatory Visit: Payer: Self-pay

## 2021-01-20 ENCOUNTER — Other Ambulatory Visit: Payer: Self-pay | Admitting: Dermatology

## 2021-01-20 ENCOUNTER — Ambulatory Visit: Payer: 59 | Admitting: Dermatology

## 2021-01-20 DIAGNOSIS — D235 Other benign neoplasm of skin of trunk: Secondary | ICD-10-CM | POA: Diagnosis not present

## 2021-01-20 DIAGNOSIS — B351 Tinea unguium: Secondary | ICD-10-CM

## 2021-01-20 DIAGNOSIS — D485 Neoplasm of uncertain behavior of skin: Secondary | ICD-10-CM

## 2021-01-20 DIAGNOSIS — D229 Melanocytic nevi, unspecified: Secondary | ICD-10-CM

## 2021-01-20 NOTE — Patient Instructions (Signed)

## 2021-01-25 ENCOUNTER — Encounter: Payer: Self-pay | Admitting: Dermatology

## 2021-01-25 NOTE — Progress Notes (Signed)
   New Patient   Subjective  Joshua Hansen is a 40 y.o. male who presents for the following: Skin Problem (Mole on patients back. Been there x years. It does hurt at times. Wife in room with patient. ).  Mole on back that frequently causes pain. Location:  Duration:  Quality:  Associated Signs/Symptoms: Modifying Factors:  Severity:  Timing: Context:    The following portions of the chart were reviewed this encounter and updated as appropriate:       Objective  Well appearing patient in no apparent distress; mood and affect are within normal limits. Right Upper Back Firm 4 to 5 mm pinkgray dermal papule with some tenderness with deep palpation.  Although I favor this being a benign growth, perhaps dermatofibroma, the symptoms bother the patient so he did wish to proceed with excisional punch biopsy.     Left Foot - Anterior Thickening and opacification of several toenails.  Before proceeding to obtain confirmatory scraping or fungal cultures, I chose to first discuss treatment options.  I told the patient that topical therapy is generally quite ineffective.  Oral therapy will get roughly 2 out of 3 people clear 1 year later, but recurrence is quite high and, although rare, side effects can be devastating.    A full examination was performed including scalp, head, eyes, ears, nose, lips, neck, chest, axillae, abdomen, back, buttocks, bilateral upper extremities, bilateral lower extremities, hands, feet, fingers, toes, fingernails, and toenails. All findings within normal limits unless otherwise noted below.   Assessment & Plan  Neoplasm of uncertain behavior of skin Right Upper Back  Skin / nail biopsy Type of biopsy: punch   Informed consent: discussed and consent obtained   Timeout: patient name, date of birth, surgical site, and procedure verified   Procedure prep:  Patient was prepped and draped in usual sterile fashion (Non sterile) Prep type:   Chlorhexidine Anesthesia: the lesion was anesthetized in a standard fashion   Anesthetic:  1% lidocaine w/ epinephrine 1-100,000 local infiltration Punch size:  4 mm Suture size:  4-0 Suture type: nylon   Suture removal (days):  14 Hemostasis achieved with: suture   Outcome: patient tolerated procedure well   Post-procedure details: wound care instructions given   Additional details:  X 1 suture   Specimen 1 - Surgical pathology Differential Diagnosis: atypia  Check Margins: No  Onychomycosis Left Foot - Anterior  No intervention initiated

## 2021-01-26 ENCOUNTER — Other Ambulatory Visit: Payer: Self-pay

## 2021-01-26 ENCOUNTER — Emergency Department (HOSPITAL_BASED_OUTPATIENT_CLINIC_OR_DEPARTMENT_OTHER)
Admission: EM | Admit: 2021-01-26 | Discharge: 2021-01-26 | Disposition: A | Discharge status: Home / Self Care | Payer: 59 | Attending: Emergency Medicine | Admitting: Emergency Medicine

## 2021-01-26 ENCOUNTER — Encounter (HOSPITAL_BASED_OUTPATIENT_CLINIC_OR_DEPARTMENT_OTHER): Payer: Self-pay | Admitting: Emergency Medicine

## 2021-01-26 DIAGNOSIS — Z79899 Other long term (current) drug therapy: Secondary | ICD-10-CM | POA: Insufficient documentation

## 2021-01-26 DIAGNOSIS — U071 COVID-19: Secondary | ICD-10-CM | POA: Insufficient documentation

## 2021-01-26 DIAGNOSIS — R5383 Other fatigue: Secondary | ICD-10-CM | POA: Diagnosis present

## 2021-01-26 DIAGNOSIS — Z20822 Contact with and (suspected) exposure to covid-19: Secondary | ICD-10-CM

## 2021-01-26 DIAGNOSIS — I10 Essential (primary) hypertension: Secondary | ICD-10-CM | POA: Diagnosis not present

## 2021-01-26 NOTE — ED Triage Notes (Signed)
Pt reports positive COVID home test; needs test for work; reports cough, fever and fatigue

## 2021-01-26 NOTE — Discharge Instructions (Signed)
Please read and follow all provided instructions.  Your diagnoses today include:  1. Suspected COVID-19 virus infection     Tests performed today include: Vital signs. See below for your results today.  COVID test - pending, check mychart for results  Medications prescribed:  None  Take any prescribed medications only as directed. Treatment for your infection is aimed at treating the symptoms. There are no medications, such as antibiotics, that will cure your infection.   Home care instructions:  Follow any educational materials contained in this packet.   Your illness is contagious and can be spread to others, especially during the first 3 or 4 days. It cannot be cured by antibiotics or other medicines. Take basic precautions such as washing your hands often, covering your mouth when you cough or sneeze, and avoiding public places where you could spread your illness to others.   Please continue drinking plenty of fluids.  Use over-the-counter medicines as needed as directed on packaging for symptom relief.  You may also use ibuprofen or tylenol as directed on packaging for pain or fever.  Do not take multiple medicines containing Tylenol or acetaminophen to avoid taking too much of this medication.  If you are positive for Covid-19, you should isolate yourself and not be exposed to other people for 5 days after your symptoms began. If you are not feeling better at day 5, you need to isolate yourself for a total of 10 days. If you are feeling better by day 5, you should wear a mask properly, over your nose and mouth, at all times while around other people until 10 days after your symptoms started.   Follow-up instructions: Please follow-up with your primary care provider as needed for further evaluation of your symptoms if you are not feeling better.   Return instructions:  Please return to the Emergency Department if you experience worsening symptoms.  Return to the emergency department  if you have worsening shortness of breath breathing or increased work of breathing, persistent vomiting RETURN IMMEDIATELY IF you develop shortness of breath, confusion or altered mental status, a new rash, become dizzy, faint, or poorly responsive, or are unable to be cared for at home. Please return if you have persistent vomiting and cannot keep down fluids or develop a fever that is not controlled by tylenol or motrin.   Please return if you have any other emergent concerns.  Additional Information:  Your vital signs today were: BP 138/87   Pulse 87   Temp 98.7 F (37.1 C) (Oral)   Resp 20   Ht 5\' 9"  (1.753 m)   Wt 95.3 kg   SpO2 98%   BMI 31.01 kg/m  If your blood pressure (BP) was elevated above 135/85 this visit, please have this repeated by your doctor within one month. --------------

## 2021-01-26 NOTE — ED Provider Notes (Signed)
[facility] EMERGENCY DEPARTMENT Provider Note   CSN: [id number] Arrival date & time: 01/26/21  2124     History Chief Complaint  Patient presents with   Covid Retest for work    Joshua Hansen is a 40 y.o. male.  Patient presents the emergency department for COVID-like symptoms.  They report positive home test.  Two family members are sick with similar symptoms.  Symptoms include fatigue, cough, intermittent fevers and chills.  No significant shortness of breath, persistent nausea or vomiting.  Requesting test for work.  No respiratory distress.  OTC meds prior to arrival.      Past Medical History:  Diagnosis Date   Hypertension    Rheumatic fever    as child    Patient Active Problem List   Diagnosis Date Noted   Open fracture of left tibia 08/06/2018    Past Surgical History:  Procedure Laterality Date   I & D EXTREMITY Left 08/06/2018   Procedure: IRRIGATION AND DEBRIDEMENT EXTREMITY;  Surgeon: Nicholes Stairs, MD;  Location: [place];  Service: Orthopedics;  Laterality: Left;   TIBIA IM NAIL INSERTION Left 08/06/2018   Procedure: INTRAMEDULLARY (IM) NAIL TIBIAL;  Surgeon: Nicholes Stairs, MD;  Location: [place];  Service: Orthopedics;  Laterality: Left;       No family history on file.  Social History   Tobacco Use   Smoking status: Never   Smokeless tobacco: Never  Substance Use Topics   Alcohol use: Not Currently    Home Medications Prior to Admission medications   Medication Sig Start Date End Date Taking? Authorizing Provider  amLODipine (NORVASC) 5 MG tablet TAKE 1 TABLET (5 MG TOTAL) BY MOUTH DAILY. 12/12/20 12/12/21  Saguier, Percell Miller, PA-C  buPROPion (WELLBUTRIN XL) 150 MG 24 hr tablet TAKE 1 TABLET BY MOUTH ONCE DAILY 10/25/20 10/25/21  Saguier, Percell Miller, PA-C  clonazePAM (KLONOPIN) 0.5 MG tablet TAKE 1 TABLET BY MOUTH TWICE DAILY AS NEEDED FOR ANXIETY 10/25/20 04/23/21  Saguier, Percell Miller, PA-C  diclofenac Sodium (VOLTAREN) 1 %  GEL Apply 4 g topically 4 (four) times daily as needed. 04/09/20   Hilts, Legrand Como, MD  lisinopril (ZESTRIL) 40 MG tablet TAKE 1 TABLET (40 MG TOTAL) BY MOUTH DAILY. 12/12/20 12/12/21  Saguier, Percell Miller, PA-C  meclizine (ANTIVERT) 12.5 MG tablet TAKE 1 TABLET BY MOUTH THREE TIMES DAILY AS NEEDED FOR DIZZINESS 10/21/20 10/21/21  Cardama, Grayce Sessions, MD  methocarbamol (ROBAXIN) 500 MG tablet Take 1 tablet (500 mg total) by mouth every 8 (eight) hours as needed for muscle spasms. 12/19/19   Saguier, Percell Miller, PA-C  nabumetone (RELAFEN) 500 MG tablet Take 1 tablet (500 mg total) by mouth 2 (two) times daily as needed. 04/09/20   Hilts, Legrand Como, MD  potassium chloride SA (KLOR-CON) 20 MEQ tablet TAKE 1 TABLET BY MOUTH ONCE DAILY FOR 10 DAYS 10/21/20 10/21/21  Cardama, Grayce Sessions, MD  sildenafil (VIAGRA) 50 MG tablet TAKE 1 TABLET (50 MG TOTAL) BY MOUTH DAILY AS NEEDED FOR ERECTILE DYSFUNCTION. 12/12/20 12/12/21  Saguier, Percell Miller, PA-C    Allergies    Patient has no known allergies.  Review of Systems   Review of Systems  Constitutional:  Positive for chills, fatigue and fever.  HENT:  Positive for congestion and sore throat. Negative for ear pain, rhinorrhea and sinus pressure.   Eyes:  Negative for redness.  Respiratory:  Positive for cough. Negative for wheezing.   Gastrointestinal:  Negative for abdominal pain, diarrhea, nausea and vomiting.  Genitourinary:  Negative  for dysuria.  Musculoskeletal:  Positive for myalgias. Negative for neck stiffness.  Skin:  Negative for rash.  Neurological:  Negative for headaches.  Hematological:  Negative for adenopathy.   Physical Exam Updated Vital Signs BP 138/87   Pulse 87   Temp 98.7 F (37.1 C) (Oral)   Resp 20   Ht 5\' 9"  (1.753 m)   Wt 95.3 kg   SpO2 98%   BMI 31.01 kg/m   Physical Exam Vitals and nursing note reviewed.  Constitutional:      Appearance: He is well-developed.  HENT:     Head: Normocephalic and atraumatic.     Jaw: No trismus.      Right Ear: Tympanic membrane, ear canal and external ear normal.     Left Ear: Tympanic membrane, ear canal and external ear normal.     Nose: Nose normal. No mucosal edema or rhinorrhea.     Mouth/Throat:     Mouth: Mucous membranes are not dry.     Pharynx: Uvula midline. No oropharyngeal exudate, posterior oropharyngeal erythema or uvula swelling.     Tonsils: No tonsillar abscesses.  Eyes:     General:        Right eye: No discharge.        Left eye: No discharge.     Conjunctiva/sclera: Conjunctivae normal.  Cardiovascular:     Rate and Rhythm: Normal rate and regular rhythm.     Heart sounds: Normal heart sounds.  Pulmonary:     Effort: Pulmonary effort is normal. No respiratory distress.     Breath sounds: Normal breath sounds. No wheezing or rales.  Abdominal:     Palpations: Abdomen is soft.     Tenderness: There is no abdominal tenderness.  Musculoskeletal:     Cervical back: Normal range of motion and neck supple.  Skin:    General: Skin is warm and dry.  Neurological:     Mental Status: He is alert.    ED Results / Procedures / Treatments   Labs (all labs ordered are listed, but only abnormal results are displayed) Labs Reviewed  SARS CORONAVIRUS 2 (TAT 6-24 HRS)    EKG None  Radiology No results found.  Procedures Procedures   Medications Ordered in ED Medications - No data to display  ED Course  I have reviewed the triage vital signs and the nursing notes.  Pertinent labs & imaging results that were available during my care of the patient were reviewed by me and considered in my medical decision making (see chart for details).  Patient seen and examined. COVID testing ordered.   Vital signs reviewed and are as follows: BP 138/87   Pulse 87   Temp 98.7 F (37.1 C) (Oral)   Resp 20   Ht 5\' 9"  (1.753 m)   Wt 95.3 kg   SpO2 98%   BMI 31.01 kg/m   Detailed discussion had with with patient regarding COVID-19 precautions and written  instructions given as well.  We discussed need to isolate themselves for 5 days from onset of symptoms and have 24 hours of improvement prior to breaking isolation.  We discussed that when breaking isolation, mask wearing for 5 additional days is required.  We discussed signs symptoms to return which include worsening shortness of breath, trouble breathing, or increased work of breathing.  Also return with persistent vomiting, confusion, passing out, or if they have any other concerns. Counseled on the need for rest and good hydration. Discussed that high-risk contacts  should be aware of positive result and they need to quarantine and be tested if they develop any symptoms. Patient verbalizes understanding.   [facility] [address] Harrel Hansen was evaluated in Emergency Department on 01/26/2021 for the symptoms described in the history of present illness. He was evaluated in the context of the global COVID-19 pandemic, which necessitated consideration that the patient might be at risk for infection with the SARS-CoV-2 virus that causes COVID-19. Institutional protocols and algorithms that pertain to the evaluation of patients at risk for COVID-19 are in a state of rapid change based on information released by regulatory bodies including the CDC and federal and state organizations. These policies and algorithms were followed during the patient's care in the ED.     MDM Rules/Calculators/A&P                          Patient with COVID symptoms, on day 4.  Appears well, nontoxic.  Vital signs reassuring.  Will continue symptomatic care at home.  Final Clinical Impression(s) / ED Diagnoses Final diagnoses:  Suspected COVID-19 virus infection    Rx / DC Orders ED Discharge Orders     None        Carlisle Cater, PA-C 01/26/21 Madill, [place], DO 01/26/21 2319

## 2021-01-27 LAB — SARS CORONAVIRUS 2 (TAT 6-24 HRS): SARS Coronavirus 2: POSITIVE — AB

## 2021-01-30 ENCOUNTER — Ambulatory Visit: Payer: 59

## 2021-01-31 ENCOUNTER — Other Ambulatory Visit (HOSPITAL_BASED_OUTPATIENT_CLINIC_OR_DEPARTMENT_OTHER): Payer: Self-pay

## 2021-01-31 ENCOUNTER — Other Ambulatory Visit: Payer: Self-pay

## 2021-01-31 ENCOUNTER — Emergency Department (HOSPITAL_BASED_OUTPATIENT_CLINIC_OR_DEPARTMENT_OTHER)
Admission: EM | Admit: 2021-01-31 | Discharge: 2021-01-31 | Disposition: A | Discharge status: Home / Self Care | Payer: 59 | Attending: Emergency Medicine | Admitting: Emergency Medicine

## 2021-01-31 ENCOUNTER — Encounter (HOSPITAL_BASED_OUTPATIENT_CLINIC_OR_DEPARTMENT_OTHER): Payer: Self-pay | Admitting: *Deleted

## 2021-01-31 DIAGNOSIS — I10 Essential (primary) hypertension: Secondary | ICD-10-CM | POA: Insufficient documentation

## 2021-01-31 DIAGNOSIS — U071 COVID-19: Secondary | ICD-10-CM | POA: Insufficient documentation

## 2021-01-31 DIAGNOSIS — M5442 Lumbago with sciatica, left side: Secondary | ICD-10-CM | POA: Diagnosis not present

## 2021-01-31 DIAGNOSIS — M5432 Sciatica, left side: Secondary | ICD-10-CM

## 2021-01-31 DIAGNOSIS — M545 Low back pain, unspecified: Secondary | ICD-10-CM | POA: Diagnosis present

## 2021-01-31 DIAGNOSIS — Z79899 Other long term (current) drug therapy: Secondary | ICD-10-CM | POA: Insufficient documentation

## 2021-01-31 MED ORDER — KETOROLAC TROMETHAMINE 30 MG/ML IJ SOLN
30.0000 mg | Freq: Once | INTRAMUSCULAR | Status: AC
  Administered 2021-01-31: 30 mg via INTRAMUSCULAR
  Filled 2021-01-31: qty 1

## 2021-01-31 MED ORDER — PREDNISONE 10 MG PO TABS
ORAL_TABLET | Freq: Every day | ORAL | 0 refills | Status: DC
  Filled 2021-01-31: qty 42, 12d supply, fill #0

## 2021-01-31 MED ORDER — METHOCARBAMOL 500 MG PO TABS
500.0000 mg | ORAL_TABLET | Freq: Two times a day (BID) | ORAL | 0 refills | Status: DC
  Filled 2021-01-31: qty 20, 10d supply, fill #0

## 2021-01-31 MED ORDER — DEXAMETHASONE SODIUM PHOSPHATE 10 MG/ML IJ SOLN
10.0000 mg | Freq: Once | INTRAMUSCULAR | Status: AC
  Administered 2021-01-31: 10 mg via INTRAMUSCULAR
  Filled 2021-01-31: qty 1

## 2021-01-31 MED ORDER — HYDROCODONE-ACETAMINOPHEN 5-325 MG PO TABS
1.0000 | ORAL_TABLET | ORAL | 0 refills | Status: DC | PRN
  Filled 2021-01-31: qty 10, 2d supply, fill #0
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
## 2021-02-19 ENCOUNTER — Other Ambulatory Visit (HOSPITAL_BASED_OUTPATIENT_CLINIC_OR_DEPARTMENT_OTHER): Payer: Self-pay

## 2021-02-25 ENCOUNTER — Other Ambulatory Visit: Payer: Self-pay

## 2021-04-04 ENCOUNTER — Encounter: Payer: Self-pay | Admitting: Medical

## 2021-04-08 ENCOUNTER — Other Ambulatory Visit (HOSPITAL_BASED_OUTPATIENT_CLINIC_OR_DEPARTMENT_OTHER): Payer: Self-pay

## 2021-04-08 ENCOUNTER — Other Ambulatory Visit: Payer: Self-pay | Admitting: Medical

## 2021-04-09 ENCOUNTER — Other Ambulatory Visit (HOSPITAL_BASED_OUTPATIENT_CLINIC_OR_DEPARTMENT_OTHER): Payer: Self-pay

## 2021-04-09 MED ORDER — AMLODIPINE BESYLATE 5 MG PO TABS
ORAL_TABLET | Freq: Every day | ORAL | 0 refills | Status: DC
  Filled 2021-04-09: qty 90, 90d supply, fill #0

## 2021-04-14 ENCOUNTER — Other Ambulatory Visit: Payer: Self-pay

## 2021-04-14 ENCOUNTER — Ambulatory Visit: Payer: 59 | Admitting: Medical

## 2021-04-14 ENCOUNTER — Encounter: Payer: Self-pay | Admitting: Medical

## 2021-04-14 VITALS — BP 115/72 | HR 72 | Resp 18 | Ht 69.0 in | Wt 210.8 lb

## 2021-04-14 DIAGNOSIS — Z23 Encounter for immunization: Secondary | ICD-10-CM | POA: Diagnosis not present

## 2021-04-14 DIAGNOSIS — F419 Anxiety disorder, unspecified: Secondary | ICD-10-CM | POA: Diagnosis not present

## 2021-04-14 DIAGNOSIS — I10 Essential (primary) hypertension: Secondary | ICD-10-CM | POA: Diagnosis not present

## 2021-04-14 NOTE — Progress Notes (Signed)
Subjective:    Patient ID: Joshua Hansen, male    DOB: 10-Dec-1980, 40 y.o.   MRN: [id number]  HPI  Pt in for follow up.  Pt will be traveling to [place] for visa interview. He will be traveling in 8 weeks. He will be in [place] for 2 weeks.   Pt states told has to have   Pt limited records indicates 2 dtp, 1 measles/sarampion and 3 polio.   MMR, Varicella, hep b ,and influenza.  Pt has been doing well with anxiety. Pt still on clonazepam. Mood better. Not taking wellbutrin.  Pt has htn. Good bp. On amlodpine and lisinopril    Review of Systems  Constitutional:  Negative for chills, fatigue and fever.  HENT:  Negative for congestion, drooling, ear discharge and ear pain.   Respiratory:  Negative for cough, chest tightness, shortness of breath and wheezing.   Cardiovascular:  Negative for chest pain and palpitations.  Genitourinary:  Negative for dysuria, flank pain and frequency.     Past Medical History:  Diagnosis Date   Hypertension    Rheumatic fever    as child     Social History   Socioeconomic History   Marital status: Married    Spouse name: Not on file   Number of children: Not on file   Years of education: Not on file   Highest education level: Not on file  Occupational History   Not on file  Tobacco Use   Smoking status: Never   Smokeless tobacco: Never  Vaping Use   Vaping Use: Never used  Substance and Sexual Activity   Alcohol use: Not Currently   Drug use: Not on file   Sexual activity: Not on file  Other Topics Concern   Not on file  Social History Narrative   Not on file   Social Determinants of Health   Financial Resource Strain: Not on file  Food Insecurity: Not on file  Transportation Needs: Not on file  Physical Activity: Not on file  Stress: Not on file  Social Connections: Not on file  Intimate Partner Violence: Not on file    Past Surgical History:  Procedure Laterality Date   I & D EXTREMITY Left  08/06/2018   Procedure: IRRIGATION AND [place];  Surgeon: Nicholes Stairs, MD;  Location: [facility];  Service: Orthopedics;  Laterality: Left;   TIBIA IM NAIL INSERTION Left 08/06/2018   Procedure: INTRAMEDULLARY (IM) NAIL TIBIAL;  Surgeon: Nicholes Stairs, MD;  Location: [facility];  Service: Orthopedics;  Laterality: Left;    No family history on file.  No Known Allergies  Current Outpatient Medications on File Prior to Visit  Medication Sig Dispense Refill   amLODipine (NORVASC) 5 MG tablet TAKE 1 TABLET (5 MG TOTAL) BY MOUTH DAILY. 90 tablet 0   buPROPion (WELLBUTRIN XL) 150 MG 24 hr tablet TAKE 1 TABLET BY MOUTH ONCE DAILY 30 tablet 0   clonazePAM (KLONOPIN) 0.5 MG tablet TAKE 1 TABLET BY MOUTH TWICE DAILY AS NEEDED FOR ANXIETY 60 tablet 0   diclofenac Sodium (VOLTAREN) 1 % GEL Apply 4 g topically 4 (four) times daily as needed. 500 g 6   HYDROcodone-acetaminophen (NORCO/VICODIN) 5-325 MG tablet Take 1 tablet by mouth every 4 (four) hours as needed. 10 tablet 0   lisinopril (ZESTRIL) 40 MG tablet TAKE 1 TABLET (40 MG TOTAL) BY MOUTH DAILY. 90 tablet 3   meclizine (ANTIVERT) 12.5 MG tablet TAKE 1 TABLET BY MOUTH THREE TIMES  DAILY AS NEEDED FOR DIZZINESS 30 tablet 0   methocarbamol (ROBAXIN) 500 MG tablet Take 1 tablet (500 mg total) by mouth 2 (two) times daily. 20 tablet 0   nabumetone (RELAFEN) 500 MG tablet Take 1 tablet (500 mg total) by mouth 2 (two) times daily as needed. 60 tablet 3   potassium chloride SA (KLOR-CON) 20 MEQ tablet TAKE 1 TABLET BY MOUTH ONCE DAILY FOR 10 DAYS 10 tablet 0   predniSONE (DELTASONE) 10 MG tablet Take 6 tabs by mouth daily  for 2 days, then 5 tabs for 2 days, then 4 tabs for 2 days, then 3 tabs for 2 days, 2 tabs for 2 days, then 1 tab by mouth daily for 2 days 42 tablet 0   sildenafil (VIAGRA) 50 MG tablet TAKE 1 TABLET (50 MG TOTAL) BY MOUTH DAILY AS NEEDED FOR ERECTILE DYSFUNCTION. 10 tablet 0   No current facility-administered  medications on file prior to visit.    BP 115/72   Pulse 72   Resp 18   Ht '5\' 9"'  (1.753 m)   Wt 210 lb 12.8 oz (95.6 kg)   SpO2 99%   BMI 31.13 kg/m        Objective:   Physical Exam  General- No acute distress. Pleasant patient. Neck- Full range of motion, no jvd Lungs- Clear, even and unlabored. Heart- regular rate and rhythm. Neurologic- CNII- XII grossly intact.       Assessment & Plan:   Patient Instructions  Your bp is well controlled. Continue amlodipine and lisinopril.  Anxiety controlled recently. Will refill your clonazepam when due.   For vaccines needed per immigration will give flu vaccine today. Hep b 1st vaccine today and 2nd in series in one month.  Flu vaccine today  Can get set up for mmr vaccine and varicella in one week(nurse visit)  Recomnending asking the pharmacy downstairs when you can get the most updated version of covid vaccine.  Follow up 3-4 months controlled med visit and bp check. Or sooner if needed    [employer], PA-C

## 2021-04-14 NOTE — Patient Instructions (Addendum)
Your bp is well controlled. Continue amlodipine and lisinopril.  Anxiety controlled recently. Will refill your clonazepam when due.   For vaccines needed per immigration will give flu vaccine today. Hep b 1st vaccine today and 2nd in series in one month.  Flu vaccine today  Can get set up for mmr vaccine and varicella in one week(nurse visit)  Recomnending asking the pharmacy downstairs when you can get the most updated version of covid vaccine.  Follow up 3-4 months controlled med visit and bp check. Or sooner if needed

## 2021-04-25 ENCOUNTER — Other Ambulatory Visit: Payer: Self-pay

## 2021-04-25 ENCOUNTER — Ambulatory Visit (INDEPENDENT_AMBULATORY_CARE_PROVIDER_SITE_OTHER): Payer: 59

## 2021-04-25 DIAGNOSIS — Z23 Encounter for immunization: Secondary | ICD-10-CM

## 2021-04-25 NOTE — Progress Notes (Signed)
Pt is here today for MMRV vaccine. Pt was given MMRV vaccine in left subq. Pt tolerated well

## 2021-05-13 ENCOUNTER — Other Ambulatory Visit: Payer: Self-pay | Admitting: Medical

## 2021-05-13 ENCOUNTER — Other Ambulatory Visit (HOSPITAL_BASED_OUTPATIENT_CLINIC_OR_DEPARTMENT_OTHER): Payer: Self-pay

## 2021-05-14 ENCOUNTER — Other Ambulatory Visit (HOSPITAL_BASED_OUTPATIENT_CLINIC_OR_DEPARTMENT_OTHER): Payer: Self-pay

## 2021-05-14 ENCOUNTER — Ambulatory Visit: Payer: 59 | Admitting: Medical

## 2021-05-14 ENCOUNTER — Other Ambulatory Visit: Payer: Self-pay

## 2021-05-14 VITALS — BP 122/72 | HR 77 | Temp 98.5°F | Resp 18 | Ht 69.0 in | Wt 209.0 lb

## 2021-05-14 DIAGNOSIS — F419 Anxiety disorder, unspecified: Secondary | ICD-10-CM

## 2021-05-14 DIAGNOSIS — F32A Depression, unspecified: Secondary | ICD-10-CM | POA: Diagnosis not present

## 2021-05-14 DIAGNOSIS — Z23 Encounter for immunization: Secondary | ICD-10-CM | POA: Diagnosis not present

## 2021-05-14 MED ORDER — CLONAZEPAM 0.5 MG PO TABS
ORAL_TABLET | Freq: Two times a day (BID) | ORAL | 0 refills | Status: DC | PRN
  Filled 2021-05-14: qty 60, 30d supply, fill #0

## 2021-05-14 MED ORDER — VENLAFAXINE HCL ER 37.5 MG PO CP24
37.5000 mg | ORAL_CAPSULE | Freq: Every day | ORAL | 2 refills | Status: DC
  Filled 2021-05-14: qty 30, 30d supply, fill #0

## 2021-05-14 MED ORDER — SILDENAFIL CITRATE 50 MG PO TABS
ORAL_TABLET | ORAL | 0 refills | Status: DC
  Filled 2021-05-14: qty 10, 50d supply, fill #0

## 2021-05-14 NOTE — Patient Instructions (Addendum)
For anxiety and depression continue clonazepam. Will add effexor as can help with both anxiety and depression.  Got 2nd hep b vaccine today. With this manufacturer only 2 needed.  Follow up in one month or sooner if needed.

## 2021-05-14 NOTE — Telephone Encounter (Signed)
Rx viagra and clonazepam sent to pharmacy.

## 2021-05-14 NOTE — Progress Notes (Signed)
Subjective:    Patient ID: Joshua Hansen, male    DOB: [birth year], 40 y.o.   MRN: [id number]  HPI  Pt in for follow up/second hep b vaccine.  Pt states his has some anxiety that persists but less than before. Only gets anxious occasionally. On clonazepam. He signed contract and up to date on uds around march.   Pt has some depression along with anxiety. Failed sertraline and wellbutrin in past. He is willing to try effexor.     Review of Systems  Constitutional:  Negative for chills, fatigue and fever.  Respiratory:  Negative for cough, chest tightness, shortness of breath and wheezing.   Cardiovascular:  Negative for chest pain and palpitations.  Gastrointestinal:  Negative for abdominal pain.  Genitourinary:  Negative for dysuria.  Musculoskeletal:  Negative for arthralgias, back pain, joint swelling and myalgias.  Neurological:  Negative for dizziness, seizures, light-headedness and headaches.  Hematological:  Negative for adenopathy. Does not bruise/bleed easily.  Psychiatric/Behavioral:  Positive for dysphoric mood. Negative for behavioral problems, decreased concentration, sleep disturbance and suicidal ideas. The patient is nervous/anxious.     Past Medical History:  Diagnosis Date   Hypertension    Rheumatic fever    as child     Social History   Socioeconomic History   Marital status: Married    Spouse name: Not on file   Number of children: Not on file   Years of education: Not on file   Highest education level: Not on file  Occupational History   Not on file  Tobacco Use   Smoking status: Never   Smokeless tobacco: Never  Vaping Use   Vaping Use: Never used  Substance and Sexual Activity   Alcohol use: Not Currently   Drug use: Not on file   Sexual activity: Not on file  Other Topics Concern   Not on file  Social History Narrative   Not on file   Social Determinants of Health   Financial Resource Strain: Not on file  Food  Insecurity: Not on file  Transportation Needs: Not on file  Physical Activity: Not on file  Stress: Not on file  Social Connections: Not on file  Intimate Partner Violence: Not on file    Past Surgical History:  Procedure Laterality Date   I & D EXTREMITY Left 08/06/2018   Procedure: IRRIGATION AND [place];  Surgeon: Nicholes Stairs, MD;  Location: [facility];  Service: Orthopedics;  Laterality: Left;   TIBIA IM NAIL INSERTION Left 08/06/2018   Procedure: INTRAMEDULLARY (IM) NAIL TIBIAL;  Surgeon: Nicholes Stairs, MD;  Location: [facility];  Service: Orthopedics;  Laterality: Left;    No family history on file.  No Known Allergies  Current Outpatient Medications on File Prior to Visit  Medication Sig Dispense Refill   amLODipine (NORVASC) 5 MG tablet TAKE 1 TABLET (5 MG TOTAL) BY MOUTH DAILY. 90 tablet 0   clonazePAM (KLONOPIN) 0.5 MG tablet TAKE 1 TABLET BY MOUTH TWICE DAILY AS NEEDED FOR ANXIETY 60 tablet 0   diclofenac Sodium (VOLTAREN) 1 % GEL Apply 4 g topically 4 (four) times daily as needed. 500 g 6   HYDROcodone-acetaminophen (NORCO/VICODIN) 5-325 MG tablet Take 1 tablet by mouth every 4 (four) hours as needed. 10 tablet 0   lisinopril (ZESTRIL) 40 MG tablet TAKE 1 TABLET (40 MG TOTAL) BY MOUTH DAILY. 90 tablet 3   meclizine (ANTIVERT) 12.5 MG tablet TAKE 1 TABLET BY MOUTH THREE TIMES DAILY AS  NEEDED FOR DIZZINESS 30 tablet 0   methocarbamol (ROBAXIN) 500 MG tablet Take 1 tablet (500 mg total) by mouth 2 (two) times daily. 20 tablet 0   nabumetone (RELAFEN) 500 MG tablet Take 1 tablet (500 mg total) by mouth 2 (two) times daily as needed. 60 tablet 3   potassium chloride SA (KLOR-CON) 20 MEQ tablet TAKE 1 TABLET BY MOUTH ONCE DAILY FOR 10 DAYS 10 tablet 0   sildenafil (VIAGRA) 50 MG tablet TAKE 1 TABLET (50 MG TOTAL) BY MOUTH DAILY AS NEEDED FOR ERECTILE DYSFUNCTION. 10 tablet 0   No current facility-administered medications on file prior to visit.    BP 122/72    Pulse 77   Temp 98.5 F (36.9 C)   Resp 18   Ht 5\' 9"  (1.753 m)   Wt 209 lb (94.8 kg)   SpO2 98%   BMI 30.86 kg/m       Objective:   Physical Exam   General- No acute distress. Pleasant patient. Neck- Full range of motion, no jvd Lungs- Clear, even and unlabored. Heart- regular rate and rhythm. Neurologic- CNII- XII grossly intact.      Assessment & Plan:   Patient Instructions  For anxiety and depression continue clonazepam. Will add effexor as can help with both anxiety and depression.  Got 2nd hep b vaccine today. With this manufacturer only 2 needed.  Follow up in one month or sooner if needed.

## 2021-06-02 ENCOUNTER — Encounter: Payer: Self-pay | Admitting: Medical

## 2021-06-02 ENCOUNTER — Telehealth: Payer: Self-pay | Admitting: Medical

## 2021-06-02 NOTE — Telephone Encounter (Signed)
Pt scheduled appointment via mychart Sunday 10.31.22 for persistent headaches, blurry vision. Contacted pt this morning and transferred to Triage regarding symptoms.

## 2021-06-02 NOTE — Telephone Encounter (Signed)
Pts wife called and stated the had received a voicemail from a nurse asking if he needed to go to the emergency room sooner than his 11/1 appt. He was triaged

## 2021-06-02 NOTE — Telephone Encounter (Signed)
Patient has appt scheduled with PCP on 06/03/2021.   [facility] - ClientTELEPHONE ADVICE RECORDAccessNurse Patient Name:Joshua Hansen ReturnPhoneNumber:[phone](Primary) Relationship To Patient Spouse Return Phone Number [phone] (Primary) Chief Complaint Headache Reason for Call Symptomatic / Request for [name] states husband has headache and vision issues. He is having light sensitivity, tunnel vision and blurred vision. During these episodes she takes his BP and it's 131/90. Translation No Disp. Time Eilene Ghazi Time) Disposition Final User 06/02/2021 10:07:18 AM Attempt made - message left Jake Bathe 06/02/2021 10:27:39 AM Attempt made - message left Jake Bathe 06/02/2021 10:42:24 AM FINAL ATTEMPT MADE - message left Yes Jearld Pies, RN, Lovena Le Comments User: Malissa Hippo, RN Date/Time Eilene Ghazi Time): 06/02/2021 10:27:28 AM Pt not with caller

## 2021-06-03 ENCOUNTER — Other Ambulatory Visit: Payer: Self-pay

## 2021-06-03 ENCOUNTER — Ambulatory Visit (INDEPENDENT_AMBULATORY_CARE_PROVIDER_SITE_OTHER): Payer: 59 | Admitting: Medical

## 2021-06-03 ENCOUNTER — Telehealth: Payer: Self-pay | Admitting: Medical

## 2021-06-03 ENCOUNTER — Other Ambulatory Visit (HOSPITAL_BASED_OUTPATIENT_CLINIC_OR_DEPARTMENT_OTHER): Payer: Self-pay

## 2021-06-03 ENCOUNTER — Encounter: Payer: Self-pay | Admitting: Medical

## 2021-06-03 VITALS — BP 130/80 | HR 104 | Temp 98.3°F | Resp 18 | Ht 69.0 in | Wt 213.0 lb

## 2021-06-03 DIAGNOSIS — R42 Dizziness and giddiness: Secondary | ICD-10-CM | POA: Diagnosis not present

## 2021-06-03 DIAGNOSIS — G4452 New daily persistent headache (NDPH): Secondary | ICD-10-CM | POA: Diagnosis not present

## 2021-06-03 DIAGNOSIS — H538 Other visual disturbances: Secondary | ICD-10-CM | POA: Diagnosis not present

## 2021-06-03 MED ORDER — CYCLOBENZAPRINE HCL 10 MG PO TABS
10.0000 mg | ORAL_TABLET | Freq: Every day | ORAL | 0 refills | Status: DC
  Filled 2021-06-03: qty 3, 3d supply, fill #0

## 2021-06-03 MED ORDER — KETOROLAC TROMETHAMINE 30 MG/ML IJ SOLN
30.0000 mg | Freq: Once | INTRAMUSCULAR | Status: AC
  Administered 2021-06-03: 30 mg via INTRAMUSCULAR

## 2021-06-03 NOTE — Telephone Encounter (Signed)
Pt spouse contacted office this morning regarding appointment scheduled. Advised pt appointment is scheduled 06/03/2021 at 3:20, per CMA pt can come to appointment.

## 2021-06-03 NOTE — Patient Instructions (Addendum)
New daily persistent headache for the past week.  Very rare transient and self-limited without headaches in the past if he were ever to get(however this ha is severe and constant).  Some sound sensitivity and intermittent dizziness.  No history of migraines.  Some slight right trapezius tenderness to palpation at  insertion to occipital area.  Considering tension headache in differential.  However the severity of headache and daily persistent for 1 week with other symptoms causes need to proceed with caution.  Will give Toradol 30 mg IM injection in the office.  Prescribing Flexeril 10 mg tablet to use 1tablet prior to sleep.  Get go ahead and place CT of head without contrast ordered.  This will need to be prior authorized.  Advised patient and wife that if he does not respond to the above then we will definitely recommend following through with CT of head provided insurance authorizes.  If with above measures you get complete 100% resolution of headache then imaging studies would not be needed.  Please give me an update tomorrow morning by MyChart whether or not headache resolved completely.  In the event of worsening or changing signs symptoms then be seen in the emergency department a CT of the head would not need prior authorization thru ED.  Follow-up in 7 days or sooner if needed.

## 2021-06-03 NOTE — Telephone Encounter (Signed)
Gwen will you look at pt ct head without contrast order. If you see complete note/plan. They will update me if ha resolved or not.  I will try to send you message thru epic. I won't be in office but will check. And update you.

## 2021-06-03 NOTE — Progress Notes (Signed)
Subjective:    Patient ID: Joshua Hansen, male    DOB: 1981/07/30, 40 y.o.   MRN: [id number]  HPI  Pt in for HA since last week. HA is every day. No light sensitivity. Sounds sometimes ha worse. No history of migraine ha. No hx of ha that has been one week. Pt has some blurred vision recently. Vision 20/20 each eye and separate on [place] exam. Level of pain 7-8/10   Ibuprofen 600 twice daily will reduce HA.  Bp on Sunday 131/90. Other days not real high.  When has has  HA will get light headed.    Review of Systems  Constitutional:  Negative for chills, fatigue and fever.  Eyes:  Positive for visual disturbance.  Respiratory:  Negative for chest tightness, shortness of breath and wheezing.   Cardiovascular:  Negative for chest pain and palpitations.  Gastrointestinal:  Negative for abdominal pain.  Musculoskeletal:  Negative for back pain.  Skin:  Negative for rash.  Neurological:  Positive for dizziness and headaches. Negative for syncope and weakness.       Sound sensitive.  Hematological:  Negative for adenopathy. Does not bruise/bleed easily.  Psychiatric/Behavioral:  Negative for behavioral problems, decreased concentration and hallucinations. The patient is not nervous/anxious.     Past Medical History:  Diagnosis Date   Hypertension    Rheumatic fever    as child     Social History   Socioeconomic History   Marital status: Married    Spouse name: Not on file   Number of children: Not on file   Years of education: Not on file   Highest education level: Not on file  Occupational History   Not on file  Tobacco Use   Smoking status: Never   Smokeless tobacco: Never  Vaping Use   Vaping Use: Never used  Substance and Sexual Activity   Alcohol use: Not Currently   Drug use: Not on file   Sexual activity: Not on file  Other Topics Concern   Not on file  Social History Narrative   Not on file   Social Determinants of Health   Financial  Resource Strain: Not on file  Food Insecurity: Not on file  Transportation Needs: Not on file  Physical Activity: Not on file  Stress: Not on file  Social Connections: Not on file  Intimate Partner Violence: Not on file    Past Surgical History:  Procedure Laterality Date   I & D EXTREMITY Left 08/06/2018   Procedure: IRRIGATION AND [place];  Surgeon: Nicholes Stairs, MD;  Location: [place];  Service: Orthopedics;  Laterality: Left;   TIBIA IM NAIL INSERTION Left 08/06/2018   Procedure: INTRAMEDULLARY (IM) NAIL TIBIAL;  Surgeon: Nicholes Stairs, MD;  Location: [facility];  Service: Orthopedics;  Laterality: Left;    No family history on file.  No Known Allergies  Current Outpatient Medications on File Prior to Visit  Medication Sig Dispense Refill   amLODipine (NORVASC) 5 MG tablet TAKE 1 TABLET (5 MG TOTAL) BY MOUTH DAILY. 90 tablet 0   clonazePAM (KLONOPIN) 0.5 MG tablet TAKE 1 TABLET BY MOUTH TWICE DAILY AS NEEDED FOR ANXIETY 60 tablet 0   diclofenac Sodium (VOLTAREN) 1 % GEL Apply 4 g topically 4 (four) times daily as needed. 500 g 6   HYDROcodone-acetaminophen (NORCO/VICODIN) 5-325 MG tablet Take 1 tablet by mouth every 4 (four) hours as needed. 10 tablet 0   lisinopril (ZESTRIL) 40 MG tablet TAKE 1  TABLET (40 MG TOTAL) BY MOUTH DAILY. 90 tablet 3   meclizine (ANTIVERT) 12.5 MG tablet TAKE 1 TABLET BY MOUTH THREE TIMES DAILY AS NEEDED FOR DIZZINESS 30 tablet 0   methocarbamol (ROBAXIN) 500 MG tablet Take 1 tablet (500 mg total) by mouth 2 (two) times daily. 20 tablet 0   nabumetone (RELAFEN) 500 MG tablet Take 1 tablet (500 mg total) by mouth 2 (two) times daily as needed. 60 tablet 3   potassium chloride SA (KLOR-CON) 20 MEQ tablet TAKE 1 TABLET BY MOUTH ONCE DAILY FOR 10 DAYS 10 tablet 0   sildenafil (VIAGRA) 50 MG tablet TAKE 1 TABLET (50 MG TOTAL) BY MOUTH DAILY AS NEEDED FOR ERECTILE DYSFUNCTION. 10 tablet 0   venlafaxine XR (EFFEXOR XR) 37.5 MG 24 hr capsule  Take 1 capsule (37.5 mg total) by mouth daily with breakfast. 30 capsule 2   No current facility-administered medications on file prior to visit.    BP 130/80   Pulse (!) 104   Temp 98.3 F (36.8 C)   Resp 18   Ht 5\' 9"  (1.753 m)   Wt 213 lb (96.6 kg)   SpO2 98%   BMI 31.45 kg/m       Objective:   Physical Exam  General Mental Status- Alert. General Appearance- Not in acute distress.   Skin General: Color- Normal Color. Moisture- Normal Moisture.  Neck Carotid Arteries- Normal color. Moisture- Normal Moisture. No carotid bruits. No JVD.  Chest and Lung Exam Auscultation: Breath Sounds:-Normal.  Cardiovascular Auscultation:Rythm- Regular. Murmurs & Other Heart Sounds:Auscultation of the heart reveals- No Murmurs.  Abdomen Inspection:-Inspeection Normal. Palpation/Percussion:Note:No mass. Palpation and Percussion of the abdomen reveal- Non Tender, Non Distended + BS, no rebound or guarding.    Neurologic Cranial Nerve exam:- CN III-XII intact(No nystagmus), symmetric smile. Drift Test:- No drift. Romberg Exam:- Negative.  Heal to Toe Gait exam:-Normal. Finger to Nose:- Normal/Intact Strength:- 5/5 equal and symmetric strength both upper and lower extremities.   Heent- no sinus pressure to palpation. Rt temporal area no tenderness to palpation. No dilated veins.      Assessment & Plan:   Patient Instructions  New daily persistent headache for the past week.  Very rare transient and self-limited without headaches in the past if he were ever to get.  Some sound sensitivity and intermittent dizziness.  No history of migraines.  Some slight right trapezius tenderness to palpation at this insertion to occipital area.  Considering tension headache in differential.  However the severity of headache and daily persistent for 1 week with other symptoms causes need to proceed with caution.  Will give Toradol 30 mg IM injection in the office.  Prescribing Flexeril 10 mg  tablet to use 1tablet prior to sleep.  Get go ahead and place CT of head without contrast ordered.  This will need to be prior authorized.  Advised patient and wife that if he does not respond to the above then we will definitely recommend following through with CT of head provided insurance authorizes.  If with above measures you get complete 100% resolution of headache then imaging studies would not be needed.  Please give me an update tomorrow morning by MyChart whether or not headache resolved completely.  In the event of worsening or changing signs symptoms then be seen in the emergency department a CT of the head would not need prior authorization thru ED.  Follow-up in 7 days or sooner if needed.   [name], PA-C

## 2021-06-03 NOTE — Telephone Encounter (Signed)
Patient actually has an appointment today at 3:20

## 2021-06-06 ENCOUNTER — Ambulatory Visit (HOSPITAL_BASED_OUTPATIENT_CLINIC_OR_DEPARTMENT_OTHER)
Admission: RE | Admit: 2021-06-06 | Discharge: 2021-06-06 | Disposition: A | Discharge status: Home / Self Care | Payer: 59 | Source: Ambulatory Visit | Attending: Medical | Admitting: Medical

## 2021-06-06 ENCOUNTER — Other Ambulatory Visit: Payer: Self-pay

## 2021-06-06 DIAGNOSIS — R519 Headache, unspecified: Secondary | ICD-10-CM | POA: Diagnosis not present

## 2021-06-06 DIAGNOSIS — G4452 New daily persistent headache (NDPH): Secondary | ICD-10-CM | POA: Insufficient documentation

## 2021-06-09 ENCOUNTER — Other Ambulatory Visit (HOSPITAL_BASED_OUTPATIENT_CLINIC_OR_DEPARTMENT_OTHER): Payer: Self-pay

## 2021-06-09 MED ORDER — AMOXICILLIN-POT CLAVULANATE 875-125 MG PO TABS
1.0000 | ORAL_TABLET | Freq: Two times a day (BID) | ORAL | 0 refills | Status: DC
  Filled 2021-06-09: qty 20, 10d supply, fill #0

## 2021-06-09 NOTE — Addendum Note (Signed)
Addended by: Anabel Halon on: 06/09/2021 09:12 AM   Modules accepted: Orders

## 2021-06-10 ENCOUNTER — Telehealth: Payer: Self-pay | Admitting: Medical

## 2021-06-10 NOTE — Telephone Encounter (Signed)
Patient has appt on Friday 111/11  And he is requesting his immunzation record to given to him at the appt

## 2021-06-13 ENCOUNTER — Ambulatory Visit: Payer: Self-pay | Attending: Internal Medicine

## 2021-06-13 ENCOUNTER — Other Ambulatory Visit: Payer: Self-pay

## 2021-06-13 ENCOUNTER — Other Ambulatory Visit (HOSPITAL_BASED_OUTPATIENT_CLINIC_OR_DEPARTMENT_OTHER): Payer: Self-pay

## 2021-06-13 ENCOUNTER — Ambulatory Visit: Payer: 59 | Admitting: Medical

## 2021-06-13 VITALS — BP 113/79 | HR 85 | Temp 98.5°F | Ht 69.0 in | Wt 214.6 lb

## 2021-06-13 DIAGNOSIS — F419 Anxiety disorder, unspecified: Secondary | ICD-10-CM

## 2021-06-13 DIAGNOSIS — F32A Depression, unspecified: Secondary | ICD-10-CM | POA: Diagnosis not present

## 2021-06-13 DIAGNOSIS — J0100 Acute maxillary sinusitis, unspecified: Secondary | ICD-10-CM | POA: Diagnosis not present

## 2021-06-13 DIAGNOSIS — T1491XA Suicide attempt, initial encounter: Secondary | ICD-10-CM | POA: Diagnosis not present

## 2021-06-13 DIAGNOSIS — G44219 Episodic tension-type headache, not intractable: Secondary | ICD-10-CM

## 2021-06-13 DIAGNOSIS — Z23 Encounter for immunization: Secondary | ICD-10-CM

## 2021-06-13 MED ORDER — CYCLOBENZAPRINE HCL 10 MG PO TABS
ORAL_TABLET | ORAL | 0 refills | Status: DC
  Filled 2021-06-13: qty 10, 10d supply, fill #0

## 2021-06-13 MED ORDER — BUPROPION HCL ER (XL) 150 MG PO TB24
150.0000 mg | ORAL_TABLET | Freq: Every day | ORAL | 3 refills | Status: DC
  Filled 2021-06-13: qty 30, 30d supply, fill #0

## 2021-06-13 NOTE — Progress Notes (Signed)
   Covid-19 Vaccination Clinic  Name:  Yancarlos Berthold    MRN: [id number] DOB: 04-12-1981  06/13/2021  Mr. Delcruz was observed post Covid-19 immunization for 15 minutes without incident. He was provided with Vaccine Information Sheet and instruction to access the V-Safe system.   Patient finished 15 minutes of observation at [place] office in same building.  Mr. Ferne Reus was instructed to call 911 with any severe reactions post vaccine: Difficulty breathing  Swelling of face and throat  A fast heartbeat  A bad rash all over body  Dizziness and weakness   Immunizations Administered     Name Date Dose VIS Date Route   Pfizer Covid-19 Vaccine Bivalent Booster 06/13/2021  2:56 PM 0.3 mL 04/02/2021 Intramuscular   Manufacturer: [place]   Lot: [id number]   [place]: [phone]

## 2021-06-13 NOTE — Patient Instructions (Addendum)
For depression and anxiety continue clonazepam as needed.  Up-to-date on urine drug screen and controlled medication contract.  Decided to prescribe Wellbutrin again as he reports that that helped you the most in the past compared to other antidepressants.   Recent CT of the head was negative for intracranial pathology but it did show sinus infection.  I gave you Augmentin antibiotic.  Recommend that you take entire 10-day course.  Your headache did respond well to Toradol injection the other day.  Also on exam today you have tenderness where trapezius insertion into the occipital region.  Suspect strongly that she has had probable tension headaches.  Making Flexeril available to use 1 tablet at night if you have recurrent occipital region headache with neck pain/trapezius tightness.  Rx advisement given with Flexeril.  Could use this in combination with Tylenol and low-dose ibuprofen.  Follow-up in 1 month or sooner if needed.

## 2021-06-13 NOTE — Progress Notes (Signed)
Subjective:    Patient ID: Joshua Hansen, male    DOB: Dec 24, 1980, 40 y.o.   MRN: [id number]  HPI Pt in for follow up.  Pt states for follow up ha. He states last visit when he got toradol his Lamonte Sakai went away within 30 minutes.   Now having occasional mild ha. Has not return like before. He points only mild pain back of occipital area.   Ct head showed some sinus inflammation/possible infection. Pt did take the antibiotic.  Pt has history of depession. Pt states last rx of effexor. He states had side effect of tight sensation in mouth mucsles. No other symptoms. He wants to get back on wellbutin.  Pt is still using clonazepam for anxiety. Anxiety occurs periodic/not every day. About 1-2 days a week presently.      Review of Systems  Constitutional:  Negative for chills, fatigue and fever.  HENT:  Negative for congestion, drooling and ear discharge.   Respiratory:  Negative for cough, choking, chest tightness, shortness of breath and wheezing.   Cardiovascular:  Negative for chest pain and palpitations.  Gastrointestinal:  Negative for abdominal pain and nausea.  Genitourinary:  Negative for difficulty urinating and enuresis.  Musculoskeletal:  Negative for arthralgias and back pain.  Skin:  Negative for rash.  Neurological:  Negative for syncope, weakness and headaches.       See hpi.  Hematological:  Negative for adenopathy. Does not bruise/bleed easily.  Psychiatric/Behavioral:  Positive for dysphoric mood. Negative for behavioral problems, decreased concentration, sleep disturbance and suicidal ideas. The patient is nervous/anxious.    Past Medical History:  Diagnosis Date   Hypertension    Rheumatic fever    as child     Social History   Socioeconomic History   Marital status: Married    Spouse name: Not on file   Number of children: Not on file   Years of education: Not on file   Highest education level: Not on file  Occupational History   Not on  file  Tobacco Use   Smoking status: Never   Smokeless tobacco: Never  Vaping Use   Vaping Use: Never used  Substance and Sexual Activity   Alcohol use: Not Currently   Drug use: Not on file   Sexual activity: Not on file  Other Topics Concern   Not on file  Social History Narrative   Not on file   Social Determinants of Health   Financial Resource Strain: Not on file  Food Insecurity: Not on file  Transportation Needs: Not on file  Physical Activity: Not on file  Stress: Not on file  Social Connections: Not on file  Intimate Partner Violence: Not on file    Past Surgical History:  Procedure Laterality Date   I & D EXTREMITY Left 08/06/2018   Procedure: IRRIGATION AND [place];  Surgeon: Nicholes Stairs, MD;  Location: [facility];  Service: Orthopedics;  Laterality: Left;   TIBIA IM NAIL INSERTION Left 08/06/2018   Procedure: INTRAMEDULLARY (IM) NAIL TIBIAL;  Surgeon: Nicholes Stairs, MD;  Location: [facility];  Service: Orthopedics;  Laterality: Left;    No family history on file.  No Known Allergies  Current Outpatient Medications on File Prior to Visit  Medication Sig Dispense Refill   amLODipine (NORVASC) 5 MG tablet TAKE 1 TABLET (5 MG TOTAL) BY MOUTH DAILY. 90 tablet 0   amoxicillin-clavulanate (AUGMENTIN) 875-125 MG tablet Take 1 tablet by mouth 2 (two) times daily. [place]  tablet 0   clonazePAM (KLONOPIN) 0.5 MG tablet TAKE 1 TABLET BY MOUTH TWICE DAILY AS NEEDED FOR ANXIETY 60 tablet 0   cyclobenzaprine (FLEXERIL) 10 MG tablet Take 1 tablet (10 mg total) by mouth at bedtime. 3 tablet 0   diclofenac Sodium (VOLTAREN) 1 % GEL Apply 4 g topically 4 (four) times daily as needed. 500 g 6   HYDROcodone-acetaminophen (NORCO/VICODIN) 5-325 MG tablet Take 1 tablet by mouth every 4 (four) hours as needed. 10 tablet 0   lisinopril (ZESTRIL) 40 MG tablet TAKE 1 TABLET (40 MG TOTAL) BY MOUTH DAILY. 90 tablet 3   meclizine (ANTIVERT) 12.5 MG tablet TAKE 1 TABLET BY  MOUTH THREE TIMES DAILY AS NEEDED FOR DIZZINESS 30 tablet 0   nabumetone (RELAFEN) 500 MG tablet Take 1 tablet (500 mg total) by mouth 2 (two) times daily as needed. 60 tablet 3   potassium chloride SA (KLOR-CON) 20 MEQ tablet TAKE 1 TABLET BY MOUTH ONCE DAILY FOR 10 DAYS 10 tablet 0   sildenafil (VIAGRA) 50 MG tablet TAKE 1 TABLET (50 MG TOTAL) BY MOUTH DAILY AS NEEDED FOR ERECTILE DYSFUNCTION. 10 tablet 0   venlafaxine XR (EFFEXOR XR) 37.5 MG 24 hr capsule Take 1 capsule (37.5 mg total) by mouth daily with breakfast. 30 capsule 2   No current facility-administered medications on file prior to visit.    BP 113/79   Pulse 85   Temp 98.5 F (36.9 C)   Ht 5\' 9"  (1.753 m)   Wt 214 lb 9.6 oz (97.3 kg)   SpO2 97%   BMI 31.69 kg/m       Objective:   Physical Exam  General Mental Status- Alert. General Appearance- Not in acute distress.   Skin General: Color- Normal Color. Moisture- Normal Moisture.  Neck Full range of motion.  No nuchal rigidity.  Right-sided trapezius tenderness to palpation as it inserts into the occipital region right side.  Chest and Lung Exam Auscultation: Breath Sounds:-Normal.  Cardiovascular Auscultation:Rythm- Regular. Murmurs & Other Heart Sounds:Auscultation of the heart reveals- No Murmurs.  Abdomen Inspection:-Inspeection Normal. Palpation/Percussion:Note:No mass. Palpation and Percussion of the abdomen reveal- Non Tender, Non Distended + BS, no rebound or guarding.    Neurologic Cranial Nerve exam:- CN III-XII intact(No nystagmus), symmetric smile. Strength:- 5/5 equal and symmetric strength both upper and lower extremities.       Assessment & Plan:   Patient Instructions  For depression and anxiety continue clonazepam as needed.  Up-to-date on urine drug screen and controlled medication contract.  Decided to prescribe Wellbutrin again as he reports that that helped you the most in the past compared to other antidepressants.   Recent  CT of the head was negative for intracranial pathology but it did show sinus infection.  I gave you Augmentin antibiotic.  Recommend that you take entire 10-day course.  Your headache did respond well to Toradol injection the other day.  Also on exam today you have tenderness where trapezius insertion into the occipital region.  Suspect strongly that she has had probable tension headaches.  Making Flexeril available to use 1 tablet at night if you have recurrent occipital region headache with neck pain/trapezius tightness.  Rx advisement given with Flexeril.  Could use this in combination with Tylenol and low-dose ibuprofen.  Follow-up in 1 month or sooner if needed.   [name], PA-C

## 2021-06-14 DIAGNOSIS — F332 Major depressive disorder, recurrent severe without psychotic features: Secondary | ICD-10-CM

## 2021-06-14 HISTORY — DX: Major depressive disorder, recurrent severe without psychotic features: F33.2

## 2021-06-16 NOTE — Telephone Encounter (Signed)
Wife came into office stating patient overdosed on the Klonopin on Friday , he is okay but he is currently involuntary comitted at [facility] , patient's wife does want him off the medication so it wont happen again, also stated she doesn't know much because the police are being brief with her.   Call back   [phone]

## 2021-06-17 ENCOUNTER — Other Ambulatory Visit (HOSPITAL_BASED_OUTPATIENT_CLINIC_OR_DEPARTMENT_OTHER): Payer: Self-pay

## 2021-06-18 ENCOUNTER — Other Ambulatory Visit (HOSPITAL_BASED_OUTPATIENT_CLINIC_OR_DEPARTMENT_OTHER): Payer: Self-pay

## 2021-06-18 DIAGNOSIS — F439 Reaction to severe stress, unspecified: Secondary | ICD-10-CM | POA: Diagnosis not present

## 2021-06-18 DIAGNOSIS — Z9151 Personal history of suicidal behavior: Secondary | ICD-10-CM | POA: Diagnosis not present

## 2021-06-18 DIAGNOSIS — M545 Low back pain, unspecified: Secondary | ICD-10-CM | POA: Diagnosis not present

## 2021-06-18 DIAGNOSIS — E876 Hypokalemia: Secondary | ICD-10-CM | POA: Diagnosis not present

## 2021-06-18 DIAGNOSIS — G8929 Other chronic pain: Secondary | ICD-10-CM | POA: Diagnosis not present

## 2021-06-18 DIAGNOSIS — F332 Major depressive disorder, recurrent severe without psychotic features: Secondary | ICD-10-CM | POA: Diagnosis not present

## 2021-06-18 DIAGNOSIS — I10 Essential (primary) hypertension: Secondary | ICD-10-CM | POA: Diagnosis not present

## 2021-06-23 ENCOUNTER — Encounter: Payer: Self-pay | Admitting: Medical

## 2021-06-23 ENCOUNTER — Other Ambulatory Visit (HOSPITAL_BASED_OUTPATIENT_CLINIC_OR_DEPARTMENT_OTHER): Payer: Self-pay

## 2021-06-23 MED ORDER — HYDROXYZINE PAMOATE 25 MG PO CAPS
ORAL_CAPSULE | ORAL | 0 refills | Status: DC
  Filled 2021-06-23: qty 15, 3d supply, fill #0

## 2021-06-23 MED ORDER — LISINOPRIL 40 MG PO TABS
ORAL_TABLET | ORAL | 0 refills | Status: DC
  Filled 2021-06-23: qty 30, 30d supply, fill #0

## 2021-06-23 MED ORDER — BUPROPION HCL ER (XL) 300 MG PO TB24
ORAL_TABLET | ORAL | 0 refills | Status: DC
  Filled 2021-06-23: qty 30, 30d supply, fill #0

## 2021-06-23 MED ORDER — AMLODIPINE BESYLATE 5 MG PO TABS
ORAL_TABLET | ORAL | 0 refills | Status: DC
  Filled 2021-06-23: qty 30, 30d supply, fill #0

## 2021-06-23 NOTE — Addendum Note (Signed)
Addended by: Anabel Halon on: 06/23/2021 11:57 AM   Modules accepted: Orders

## 2021-07-01 ENCOUNTER — Other Ambulatory Visit (HOSPITAL_BASED_OUTPATIENT_CLINIC_OR_DEPARTMENT_OTHER): Payer: Self-pay

## 2021-07-01 MED ORDER — PFIZER COVID-19 VAC BIVALENT 30 MCG/0.3ML IM SUSP
INTRAMUSCULAR | 0 refills | Status: DC
  Filled 2021-07-01: qty 0.3, 1d supply, fill #0

## 2021-07-08 ENCOUNTER — Other Ambulatory Visit: Payer: Self-pay | Admitting: Medical

## 2021-07-08 ENCOUNTER — Other Ambulatory Visit (HOSPITAL_BASED_OUTPATIENT_CLINIC_OR_DEPARTMENT_OTHER): Payer: Self-pay

## 2021-07-08 NOTE — Telephone Encounter (Signed)
Requesting: clonazepam 0.5mg  Contract: 10/25/2020 UDS: 06/13/2021 Last Visit: 06/13/2021 Next Visit: none  Last Refill: 05/14/2021 #60 and 8EA   Please Advise

## 2021-07-11 ENCOUNTER — Other Ambulatory Visit (HOSPITAL_BASED_OUTPATIENT_CLINIC_OR_DEPARTMENT_OTHER): Payer: Self-pay

## 2021-07-11 ENCOUNTER — Other Ambulatory Visit: Payer: Self-pay | Admitting: Medical

## 2021-07-11 NOTE — Telephone Encounter (Signed)
Called number in chart , number is disconnected

## 2021-07-11 NOTE — Telephone Encounter (Signed)
Sent wife Pharmacist, community message

## 2021-07-11 NOTE — Telephone Encounter (Signed)
Patient is currently in [place] , please deny script

## 2021-07-14 ENCOUNTER — Encounter: Payer: Self-pay | Admitting: Medical

## 2021-07-14 ENCOUNTER — Other Ambulatory Visit (HOSPITAL_BASED_OUTPATIENT_CLINIC_OR_DEPARTMENT_OTHER): Payer: Self-pay

## 2021-07-14 ENCOUNTER — Telehealth: Payer: Self-pay | Admitting: Medical

## 2021-07-14 MED ORDER — CLONAZEPAM 0.5 MG PO TABS
ORAL_TABLET | Freq: Two times a day (BID) | ORAL | 0 refills | Status: DC | PRN
  Filled 2021-07-14: qty 60, 30d supply, fill #0

## 2021-07-14 NOTE — Telephone Encounter (Signed)
Refill of clonazepam sent to pt pharmacy.

## 2021-07-15 ENCOUNTER — Other Ambulatory Visit (HOSPITAL_BASED_OUTPATIENT_CLINIC_OR_DEPARTMENT_OTHER): Payer: Self-pay

## 2021-07-16 ENCOUNTER — Telehealth (HOSPITAL_COMMUNITY): Payer: Self-pay | Admitting: Psychiatry

## 2021-07-16 NOTE — Telephone Encounter (Signed)
I received a phone call today - Pt wants his wife to be able to attend the first appt. To make sure he understands everything - I wanted to check that you are okay with this as long as the release form is signed?

## 2021-07-21 ENCOUNTER — Ambulatory Visit: Payer: 59 | Admitting: Medical

## 2021-07-21 ENCOUNTER — Other Ambulatory Visit (HOSPITAL_BASED_OUTPATIENT_CLINIC_OR_DEPARTMENT_OTHER): Payer: Self-pay

## 2021-07-21 VITALS — BP 129/72 | HR 99 | Temp 98.1°F | Resp 16 | Ht 69.0 in | Wt 210.6 lb

## 2021-07-21 DIAGNOSIS — F32A Depression, unspecified: Secondary | ICD-10-CM

## 2021-07-21 DIAGNOSIS — F419 Anxiety disorder, unspecified: Secondary | ICD-10-CM | POA: Diagnosis not present

## 2021-07-21 NOTE — Patient Instructions (Signed)
Depression and anxiety.  Recent was severely depressed and you attempted suicide by taking excess clonazepam.  Presently gad 7 score of 12.  PHQ-9 score of 9.  You answered that more than half the days you have thoughts of being better off dead or of hurting yourself.  We had discussion today in the office and you explained that these are not rare and transient thoughts and not having any recently.  Discussed it is very important that if you have thoughts that are constant or making plans that you be seen for further evaluation at [facility] emergency department.  I am prescribing clonazepam for your anxiety.  Presently I am not going to prescribe your Wellbutrin since I  had concerns for seizures and brain injury in the event that you were to use excessive number of Wellbutrin.  Note all medications can cause severe injury  if you use excessively currently.     Please keep your appointment with psychiatrist in early January. I had given list of behavioral health/counselors that I want you to try to call to schedule appointment with as well.  Would asked that she send me a MyChart message in 10 days as to how you are doing.  Sooner if needed.  Follow-up in about 1 week post psychiatrist appointment or sooner if needed.

## 2021-07-21 NOTE — Progress Notes (Signed)
Subjective:    Patient ID: Joshua Hansen, male    DOB: 04-12-1981, 40 y.o.   MRN: [id number]  HPI  Pt in with spanish interpretor.  Pt went to the ED after he tried to commit suicide. Failed attempt he states used clonazepam but did not use bupropion.  Pt feels mild depression presently. He has no homicidal or suicidal ideation.   See GAD7 and phq-9 score.  Reviewed 06-13-2021 admission notes.      Review of Systems  Constitutional:  Negative for chills, fatigue and fever.  Respiratory:  Negative for cough, chest tightness, shortness of breath and wheezing.   Cardiovascular:  Negative for chest pain and palpitations.  Gastrointestinal:  Negative for abdominal pain.  Genitourinary:  Negative for dysuria, flank pain and frequency.  Musculoskeletal:  Negative for back pain and joint swelling.  Neurological:  Negative for dizziness, seizures, syncope, weakness and light-headedness.  Hematological:  Negative for adenopathy. Does not bruise/bleed easily.  Psychiatric/Behavioral:  Positive for dysphoric mood. Negative for behavioral problems, decreased concentration, hallucinations, sleep disturbance and suicidal ideas. The patient is nervous/anxious.    Past Medical History:  Diagnosis Date   Hypertension    Rheumatic fever    as child     Social History   Socioeconomic History   Marital status: Married    Spouse name: Not on file   Number of children: Not on file   Years of education: Not on file   Highest education level: Not on file  Occupational History   Not on file  Tobacco Use   Smoking status: Never   Smokeless tobacco: Never  Vaping Use   Vaping Use: Never used  Substance and Sexual Activity   Alcohol use: Not Currently   Drug use: Not on file   Sexual activity: Not on file  Other Topics Concern   Not on file  Social History Narrative   Not on file   Social Determinants of Health   Financial Resource Strain: Not on file  Food  Insecurity: Not on file  Transportation Needs: Not on file  Physical Activity: Not on file  Stress: Not on file  Social Connections: Not on file  Intimate Partner Violence: Not on file    Past Surgical History:  Procedure Laterality Date   I & D EXTREMITY Left 08/06/2018   Procedure: IRRIGATION AND [place];  Surgeon: Nicholes Stairs, MD;  Location: [place];  Service: Orthopedics;  Laterality: Left;   TIBIA IM NAIL INSERTION Left 08/06/2018   Procedure: INTRAMEDULLARY (IM) NAIL TIBIAL;  Surgeon: Nicholes Stairs, MD;  Location: [place];  Service: Orthopedics;  Laterality: Left;    No family history on file.  No Known Allergies  Current Outpatient Medications on File Prior to Visit  Medication Sig Dispense Refill   amLODipine (NORVASC) 5 MG tablet Take 1 tablet by mouth once daily 30 tablet 0   amoxicillin-clavulanate (AUGMENTIN) 875-125 MG tablet Take 1 tablet by mouth 2 (two) times daily. 20 tablet 0   buPROPion (WELLBUTRIN XL) 150 MG 24 hr tablet Take 1 tablet (150 mg total) by mouth daily. 30 tablet 3   buPROPion (WELLBUTRIN XL) 300 MG 24 hr tablet Take 1 tablet by mouth once daily 30 tablet 0   clonazePAM (KLONOPIN) 0.5 MG tablet TAKE 1 TABLET BY MOUTH TWICE DAILY AS NEEDED FOR ANXIETY 60 tablet 0   COVID-19 mRNA bivalent vaccine, Pfizer, (PFIZER COVID-19 VAC BIVALENT) injection Inject into the muscle. 0.3 mL 0  cyclobenzaprine (FLEXERIL) 10 MG tablet Take 1 tablet by mouth at bedtime as needed for neck pain or tension headache. 10 tablet 0   diclofenac Sodium (VOLTAREN) 1 % GEL Apply 4 g topically 4 (four) times daily as needed. 500 g 6   HYDROcodone-acetaminophen (NORCO/VICODIN) 5-325 MG tablet Take 1 tablet by mouth every 4 (four) hours as needed. 10 tablet 0   hydrOXYzine (VISTARIL) 25 MG capsule Take 1 capsule by mouth as needed for anxiety every 4 hours 15 capsule 0   lisinopril (ZESTRIL) 40 MG tablet TAKE 1 TABLET (40 MG TOTAL) BY MOUTH DAILY. 90 tablet 3    lisinopril (ZESTRIL) 40 MG tablet Take 1 tablet by mouth once daily 30 tablet 0   meclizine (ANTIVERT) 12.5 MG tablet TAKE 1 TABLET BY MOUTH THREE TIMES DAILY AS NEEDED FOR DIZZINESS 30 tablet 0   nabumetone (RELAFEN) 500 MG tablet Take 1 tablet (500 mg total) by mouth 2 (two) times daily as needed. 60 tablet 3   potassium chloride SA (KLOR-CON) 20 MEQ tablet TAKE 1 TABLET BY MOUTH ONCE DAILY FOR 10 DAYS 10 tablet 0   sildenafil (VIAGRA) 50 MG tablet TAKE 1 TABLET (50 MG TOTAL) BY MOUTH DAILY AS NEEDED FOR ERECTILE DYSFUNCTION. 10 tablet 0   venlafaxine XR (EFFEXOR XR) 37.5 MG 24 hr capsule Take 1 capsule (37.5 mg total) by mouth daily with breakfast. 30 capsule 2   No current facility-administered medications on file prior to visit.    BP 129/72 (BP Location: Left Arm, Patient Position: Sitting, Cuff Size: Large)    Pulse 99    Temp 98.1 F (36.7 C) (Oral)    Resp 16    Ht 5\' 9"  (1.753 m)    Wt 210 lb 9.6 oz (95.5 kg)    SpO2 98%    BMI 31.10 kg/m        Objective:   Physical Exam  General Mental Status- Alert. General Appearance- Not in acute distress.   Skin General: Color- Normal Color. Moisture- Normal Moisture.  Neck Carotid Arteries- Normal color. Moisture- Normal Moisture. No carotid bruits. No JVD.  Chest and Lung Exam Auscultation: Breath Sounds:-Normal.  Cardiovascular Auscultation:Rythm- Regular. Murmurs & Other Heart Sounds:Auscultation of the heart reveals- No Murmurs.  Abdomen Inspection:-Inspeection Normal. Palpation/Percussion:Note:No mass. Palpation and Percussion of the abdomen reveal- Non Tender, Non Distended + BS, no rebound or guarding.   Neurologic Cranial Nerve exam:- CN III-XII intact(No nystagmus), symmetric smile. Strength:- 5/5 equal and symmetric strength both upper and lower extremities.       Assessment & Plan:   Patient Instructions  Depression and anxiety.  Recent was severely depressed and you attempted suicide by taking excess  clonazepam.  Presently gad 7 score of 12.  PHQ-9 score of 9.  You answered that more than half the days you have thoughts of being better off dead or of hurting yourself.  We had discussion today in the office and you explained that these are not rare and transient thoughts and not having any recently.  Discussed it is very important that if you have thoughts that are constant or making plans that you be seen for further evaluation at [facility] emergency department.  I am prescribing clonazepam for your anxiety.  Presently I am not going to prescribe your Wellbutrin since I  had concerns for seizures and brain injury in the event that you were to use excessive number of Wellbutrin.  Note all medications can cause severe injury  if you use  excessively currently.     Please keep your appointment with psychiatrist in early January. I had given list of behavioral health/counselors that I want you to try to call to schedule appointment with as well.  Would asked that she send me a MyChart message in 10 days as to how you are doing.  Sooner if needed.  Follow-up in about 1 week post psychiatrist appointment or sooner if needed.   [name], PA-C

## 2021-07-22 ENCOUNTER — Telehealth (HOSPITAL_COMMUNITY): Payer: Self-pay | Admitting: Psychiatry

## 2021-07-22 NOTE — Telephone Encounter (Signed)
Pt's wife was upset that I had to r/s the new pt appt for 08/08/2021 to a later date. She told me he needs his medication and I told her that I recommend going to his primary or emergency room and to explain the situation about the appt. She stated that his primary will not give him more medication and that the emergency room will not give him medication and she "knows this for a fact due to working in the emergency room"   Appt was r/s to 1/30 and pt said she will look for another doctor in while waiting for upcoming appt.  Med:   buPROPion (WELLBUTRIN XL) 300 MG 24 hr tablet

## 2021-07-23 ENCOUNTER — Telehealth: Payer: Self-pay | Admitting: Medical

## 2021-07-23 ENCOUNTER — Other Ambulatory Visit (HOSPITAL_BASED_OUTPATIENT_CLINIC_OR_DEPARTMENT_OTHER): Payer: Self-pay

## 2021-07-23 MED ORDER — BUPROPION HCL ER (XL) 150 MG PO TB24
150.0000 mg | ORAL_TABLET | Freq: Every day | ORAL | 0 refills | Status: DC
  Filled 2021-07-23: qty 30, 30d supply, fill #0

## 2021-07-23 NOTE — Telephone Encounter (Signed)
Rx wellbutrin sent to pt pharmacy.

## 2021-08-08 ENCOUNTER — Telehealth (HOSPITAL_COMMUNITY): Payer: Self-pay | Admitting: Psychiatry

## 2021-08-20 DIAGNOSIS — F321 Major depressive disorder, single episode, moderate: Secondary | ICD-10-CM | POA: Diagnosis not present

## 2021-08-20 DIAGNOSIS — F4310 Post-traumatic stress disorder, unspecified: Secondary | ICD-10-CM | POA: Diagnosis not present

## 2021-08-20 DIAGNOSIS — F411 Generalized anxiety disorder: Secondary | ICD-10-CM | POA: Diagnosis not present

## 2021-08-21 ENCOUNTER — Ambulatory Visit (INDEPENDENT_AMBULATORY_CARE_PROVIDER_SITE_OTHER): Payer: 59 | Admitting: Behavioral Health

## 2021-08-21 ENCOUNTER — Other Ambulatory Visit: Payer: Self-pay

## 2021-08-21 ENCOUNTER — Encounter: Payer: Self-pay | Admitting: Behavioral Health

## 2021-08-21 ENCOUNTER — Other Ambulatory Visit (HOSPITAL_BASED_OUTPATIENT_CLINIC_OR_DEPARTMENT_OTHER): Payer: Self-pay

## 2021-08-21 VITALS — BP 139/93 | HR 73 | Ht 69.0 in | Wt 207.0 lb

## 2021-08-21 DIAGNOSIS — F4321 Adjustment disorder with depressed mood: Secondary | ICD-10-CM | POA: Diagnosis not present

## 2021-08-21 DIAGNOSIS — F411 Generalized anxiety disorder: Secondary | ICD-10-CM | POA: Diagnosis not present

## 2021-08-21 DIAGNOSIS — F331 Major depressive disorder, recurrent, moderate: Secondary | ICD-10-CM | POA: Diagnosis not present

## 2021-08-21 DIAGNOSIS — F321 Major depressive disorder, single episode, moderate: Secondary | ICD-10-CM | POA: Diagnosis not present

## 2021-08-21 DIAGNOSIS — F4310 Post-traumatic stress disorder, unspecified: Secondary | ICD-10-CM | POA: Diagnosis not present

## 2021-08-21 DIAGNOSIS — Z634 Disappearance and death of family member: Secondary | ICD-10-CM

## 2021-08-21 MED ORDER — SERTRALINE HCL 50 MG PO TABS
ORAL_TABLET | ORAL | 1 refills | Status: DC
  Filled 2021-08-21: qty 30, 30d supply, fill #0

## 2021-08-21 MED ORDER — BUPROPION HCL ER (XL) 300 MG PO TB24
ORAL_TABLET | ORAL | 0 refills | Status: DC
  Filled 2021-08-21: qty 30, 30d supply, fill #0
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
## 2021-08-28 DIAGNOSIS — F321 Major depressive disorder, single episode, moderate: Secondary | ICD-10-CM | POA: Diagnosis not present

## 2021-08-28 DIAGNOSIS — F4310 Post-traumatic stress disorder, unspecified: Secondary | ICD-10-CM | POA: Diagnosis not present

## 2021-08-28 DIAGNOSIS — F411 Generalized anxiety disorder: Secondary | ICD-10-CM | POA: Diagnosis not present

## 2021-09-01 ENCOUNTER — Telehealth (HOSPITAL_COMMUNITY): Payer: Self-pay | Admitting: Psychiatry

## 2021-09-03 DIAGNOSIS — F321 Major depressive disorder, single episode, moderate: Secondary | ICD-10-CM | POA: Diagnosis not present

## 2021-09-03 DIAGNOSIS — F4310 Post-traumatic stress disorder, unspecified: Secondary | ICD-10-CM | POA: Diagnosis not present

## 2021-09-03 DIAGNOSIS — F411 Generalized anxiety disorder: Secondary | ICD-10-CM | POA: Diagnosis not present

## 2021-09-05 ENCOUNTER — Telehealth: Payer: Self-pay

## 2021-09-05 NOTE — Telephone Encounter (Signed)
LVM to wife with info

## 2021-09-05 NOTE — Telephone Encounter (Signed)
Please advise him that zoloft or the increase on his wellbutrin can be activating for some people in the beginning. This should calm down. Advise him that he should continue taking the medication because it has not been long enough yet to adjust.  I will send in some hydroxyzine that he can take as needed for anxiety until he adjust to medication. I will discuss further next week.

## 2021-09-10 DIAGNOSIS — F4310 Post-traumatic stress disorder, unspecified: Secondary | ICD-10-CM | POA: Diagnosis not present

## 2021-09-10 DIAGNOSIS — F321 Major depressive disorder, single episode, moderate: Secondary | ICD-10-CM | POA: Diagnosis not present

## 2021-09-10 DIAGNOSIS — F411 Generalized anxiety disorder: Secondary | ICD-10-CM | POA: Diagnosis not present

## 2021-09-11 ENCOUNTER — Ambulatory Visit (INDEPENDENT_AMBULATORY_CARE_PROVIDER_SITE_OTHER): Payer: 59 | Admitting: Behavioral Health

## 2021-09-11 ENCOUNTER — Other Ambulatory Visit (HOSPITAL_BASED_OUTPATIENT_CLINIC_OR_DEPARTMENT_OTHER): Payer: Self-pay

## 2021-09-11 ENCOUNTER — Other Ambulatory Visit: Payer: Self-pay

## 2021-09-11 ENCOUNTER — Encounter: Payer: Self-pay | Admitting: Behavioral Health

## 2021-09-11 DIAGNOSIS — F331 Major depressive disorder, recurrent, moderate: Secondary | ICD-10-CM

## 2021-09-11 DIAGNOSIS — F411 Generalized anxiety disorder: Secondary | ICD-10-CM

## 2021-09-11 DIAGNOSIS — F4321 Adjustment disorder with depressed mood: Secondary | ICD-10-CM

## 2021-09-11 DIAGNOSIS — Z634 Disappearance and death of family member: Secondary | ICD-10-CM | POA: Diagnosis not present

## 2021-09-11 MED ORDER — BUPROPION HCL ER (XL) 300 MG PO TB24
ORAL_TABLET | ORAL | 3 refills | Status: DC
  Filled 2021-09-11 – 2021-09-15 (×2): qty 30, 30d supply, fill #0

## 2021-09-11 MED ORDER — BUPROPION HCL ER (XL) 300 MG PO TB24
ORAL_TABLET | ORAL | 0 refills | Status: DC
  Filled 2021-09-11: qty 30, 30d supply, fill #0
  Filled ????-??-??: fill #0

## 2021-09-11 MED ORDER — SERTRALINE HCL 50 MG PO TABS
ORAL_TABLET | ORAL | 1 refills | Status: DC
  Filled 2021-09-11 – 2021-09-15 (×2): qty 30, 30d supply, fill #0

## 2021-09-11 NOTE — Progress Notes (Signed)
[facility]  Patient ID: Joshua Hansen,  MRN: [id number]  PCP: Elise Benne  Date of Evaluation: 09/11/2021 Time spent:20 minutes  Chief Complaint:  Chief Complaint   Anxiety; Depression; Follow-up; Medication Refill; Grief     HISTORY/CURRENT STATUS: HPI   41 year old male presents to this office for initial visit and to establish care. He is spanish speaking only and a certified [facility] interpreter is present with his consent. Collateral information is reliable and also is consistent with medical record notes. Says he was unable to take the zoloft and stopped after two weeks. Says that he was having palpitations and that it increased his anxiety. He continued with Wellbutrin 300 mg and says that he is feeling much better. He does not want to try another AD and would like to continue with just the Wellbutrin. He says his anxiety today is 3/10 and depression 4/10. He is sleeping 6-7 hours per night.  He says that he has suicidal ideation often but does not have a plan. Says it has improved since last visit. He says that he realizes his mistakes and does not want to die. Says that he loves his family too much. He verbally contracts for safety and assures me that he will take medication as prescribed. Denies mania, no psychosis. SI without a plan. No HI.   Prior psychiatric medication trial: Effexor Hydroxyzine   Individual Medical History/ Review of Systems: Changes? :No   Allergies: Patient has no known allergies.  Current Medications:  Current Outpatient Medications:    amLODipine (NORVASC) 5 MG tablet, Take 1 tablet by mouth once daily, Disp: 30 tablet, Rfl: 0   buPROPion (WELLBUTRIN XL) 300 MG 24 hr tablet, Take 1 tablet by mouth once daily, Disp: 30 tablet, Rfl: 3   cyclobenzaprine (FLEXERIL) 10 MG tablet, Take 1 tablet by mouth at bedtime as needed for neck pain or tension headache., Disp: 10 tablet, Rfl: 0   HYDROcodone-acetaminophen  (NORCO/VICODIN) 5-325 MG tablet, Take 1 tablet by mouth every 4 (four) hours as needed., Disp: 10 tablet, Rfl: 0   lisinopril (ZESTRIL) 40 MG tablet, TAKE 1 TABLET (40 MG TOTAL) BY MOUTH DAILY., Disp: 90 tablet, Rfl: 3   sertraline (ZOLOFT) 50 MG tablet, Take 1/2 tablet by mouth daily for 7 days, then take one tablet daily., Disp: 30 tablet, Rfl: 1   sildenafil (VIAGRA) 50 MG tablet, TAKE 1 TABLET (50 MG TOTAL) BY MOUTH DAILY AS NEEDED FOR ERECTILE DYSFUNCTION. (Patient not taking: Reported on 08/21/2021), Disp: 10 tablet, Rfl: 0 Medication Side Effects: none  Family Medical/ Social History: Changes? No  MENTAL HEALTH EXAM:  There were no vitals taken for this visit.There is no height or weight on file to calculate BMI.  General Appearance: Casual and Neat  Eye Contact:  Good  Speech:  Clear and Coherent and with spanish interpreter  Volume:  Normal  Mood:  NA  Affect:  Appropriate  Thought Process:  Coherent  Orientation:  Full (Time, Place, and Person)  Thought Content: Logical   Suicidal Thoughts:  No  Homicidal Thoughts:  No  Memory:  WNL  Judgement:  Good  Insight:  Good  Psychomotor Activity:  Normal  Concentration:  Concentration: Good  Recall:  Good  Fund of Knowledge: Good  Language: Good  Assets:  Desire for Improvement  ADL's:  Intact  Cognition: WNL  Prognosis:  Good    DIAGNOSES:    ICD-10-CM   1. Generalized anxiety disorder  F41.1  buPROPion (WELLBUTRIN XL) 300 MG 24 hr tablet    sertraline (ZOLOFT) 50 MG tablet    DISCONTINUED: buPROPion (WELLBUTRIN XL) 300 MG 24 hr tablet    2. Major depressive disorder, recurrent episode, moderate (HCC)  F33.1 buPROPion (WELLBUTRIN XL) 300 MG 24 hr tablet    sertraline (ZOLOFT) 50 MG tablet    DISCONTINUED: buPROPion (WELLBUTRIN XL) 300 MG 24 hr tablet    3. Grief at loss of child  F43.21 buPROPion (WELLBUTRIN XL) 300 MG 24 hr tablet   Z63.4 sertraline (ZOLOFT) 50 MG tablet    DISCONTINUED: buPROPion (WELLBUTRIN XL) 300  MG 24 hr tablet      Receiving Psychotherapy: yes, [facility]    RECOMMENDATIONS:  Greater than 50% of 20  min face to face time with patient was spent on counseling and coordination of care. He was unable to tolerate Zoloft due to increased panic and anxiety. He said that he stopped after two weeks. He reports improvement with Wellbutrin alone at 300 mg. He does not want to try another AD at this time.  Today we agreed to:  Continue Wellbutrin to 300 mg daily Continue the remaining Klonopin he has left only for severe anxiety. He reports having #8 tablets left.  Will report worsening symptoms promptly To follow up in 6 weeks to reassess Provided emergency contact information Discussed potential benefits, risk, and side effects of benzodiazepines to include potential risk of tolerance and dependence, as well as possible drowsiness.  Advised patient not to drive if experiencing drowsiness and to take lowest possible effective dose to minimize risk of dependence and tolerance.  Reviewed PDMP     [name], NP

## 2021-09-12 ENCOUNTER — Other Ambulatory Visit (HOSPITAL_BASED_OUTPATIENT_CLINIC_OR_DEPARTMENT_OTHER): Payer: Self-pay

## 2021-09-12 ENCOUNTER — Other Ambulatory Visit: Payer: Self-pay | Admitting: Medical

## 2021-09-12 MED ORDER — SILDENAFIL CITRATE 50 MG PO TABS
ORAL_TABLET | ORAL | 0 refills | Status: DC
  Filled 2021-09-12: qty 10, 50d supply, fill #0

## 2021-09-15 ENCOUNTER — Other Ambulatory Visit (HOSPITAL_BASED_OUTPATIENT_CLINIC_OR_DEPARTMENT_OTHER): Payer: Self-pay

## 2021-09-30 ENCOUNTER — Other Ambulatory Visit (HOSPITAL_BASED_OUTPATIENT_CLINIC_OR_DEPARTMENT_OTHER): Payer: Self-pay

## 2021-10-03 ENCOUNTER — Other Ambulatory Visit (HOSPITAL_BASED_OUTPATIENT_CLINIC_OR_DEPARTMENT_OTHER): Payer: Self-pay

## 2021-10-03 DIAGNOSIS — F4310 Post-traumatic stress disorder, unspecified: Secondary | ICD-10-CM | POA: Diagnosis not present

## 2021-10-03 DIAGNOSIS — F411 Generalized anxiety disorder: Secondary | ICD-10-CM | POA: Diagnosis not present

## 2021-10-03 DIAGNOSIS — F321 Major depressive disorder, single episode, moderate: Secondary | ICD-10-CM | POA: Diagnosis not present

## 2021-10-06 ENCOUNTER — Other Ambulatory Visit (HOSPITAL_BASED_OUTPATIENT_CLINIC_OR_DEPARTMENT_OTHER): Payer: Self-pay

## 2021-10-06 ENCOUNTER — Other Ambulatory Visit: Payer: Self-pay | Admitting: Medical

## 2021-10-06 MED ORDER — AMLODIPINE BESYLATE 5 MG PO TABS
ORAL_TABLET | ORAL | 0 refills | Status: DC
  Filled 2021-10-06: qty 30, 30d supply, fill #0

## 2021-10-07 ENCOUNTER — Ambulatory Visit (INDEPENDENT_AMBULATORY_CARE_PROVIDER_SITE_OTHER): Payer: 59 | Admitting: Medical

## 2021-10-07 ENCOUNTER — Other Ambulatory Visit (HOSPITAL_BASED_OUTPATIENT_CLINIC_OR_DEPARTMENT_OTHER): Payer: Self-pay

## 2021-10-07 VITALS — BP 114/80 | HR 73 | Temp 98.6°F | Resp 18 | Ht 69.0 in | Wt 207.0 lb

## 2021-10-07 DIAGNOSIS — M5440 Lumbago with sciatica, unspecified side: Secondary | ICD-10-CM | POA: Diagnosis not present

## 2021-10-07 DIAGNOSIS — Z0000 Encounter for general adult medical examination without abnormal findings: Secondary | ICD-10-CM

## 2021-10-07 DIAGNOSIS — R0789 Other chest pain: Secondary | ICD-10-CM

## 2021-10-07 LAB — TROPONIN I (HIGH SENSITIVITY): High Sens Troponin I: 5 ng/L (ref 2–17)

## 2021-10-07 MED ORDER — CYCLOBENZAPRINE HCL 10 MG PO TABS
ORAL_TABLET | ORAL | 0 refills | Status: DC
Start: 2021-10-07 — End: 2023-03-15
  Filled 2021-10-07: qty 10, 10d supply, fill #0

## 2021-10-07 MED ORDER — DICLOFENAC SODIUM 75 MG PO TBEC
75.0000 mg | DELAYED_RELEASE_TABLET | Freq: Two times a day (BID) | ORAL | 0 refills | Status: DC
  Filled 2021-10-07: qty 20, 10d supply, fill #0
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
## 2021-10-07 NOTE — Patient Instructions (Addendum)
For you wellness exam today I have ordered cbc, cmp and lipid panel. ? ?Vaccine up to date. Consider yearly covid vaccine in fall. ? ?Recommend exercise and healthy diet. ? ?We will let you know lab results as they come in. ? ?Follow up date appointment will be determined after lab review.   ? ?Recommend continuing to follow up with psychiatrist office. Continue current meds. Appear stable presently. ? ?For back pain. Rx diclofenac and flexeril muscle relaxant. Xray lumbar spine today. If pain not resolving then refer to specialist as you had injection in past.  ? ?Ekg done today for atypical chest pain. Sinus rhythm but negaive t in v1 and v2( in past v2 looked normal). Refer to cardiologist per your request. If pain in chest or arm during interim then be seen in ED. Will get one set stat tropoin today. ? ? ?Preventive Care 41-62 Years Old, Male ?Preventive care refers to lifestyle choices and visits with your health care provider that can promote health and wellness. Preventive care visits are also called wellness exams. ?What can I expect for my preventive care visit? ?Counseling ?During your preventive care visit, your health care provider may ask about your: ?Medical history, including: ?Past medical problems. ?Family medical history. ?Current health, including: ?Emotional well-being. ?Home life and relationship well-being. ?Sexual activity. ?Lifestyle, including: ?Alcohol, nicotine or tobacco, and drug use. ?Access to firearms. ?Diet, exercise, and sleep habits. ?Safety issues such as seatbelt and bike helmet use. ?Sunscreen use. ?Work and work Statistician. ?Physical exam ?Your health care provider will check your: ?Height and weight. These may be used to calculate your BMI (body mass index). BMI is a measurement that tells if you are at a healthy weight. ?Waist circumference. This measures the distance around your waistline. This measurement also tells if you are at a healthy weight and may help predict your  risk of certain diseases, such as type 2 diabetes and high blood pressure. ?Heart rate and blood pressure. ?Body temperature. ?Skin for abnormal spots. ?What immunizations do I need? ?Vaccines are usually given at various ages, according to a schedule. Your health care provider will recommend vaccines for you based on your age, medical history, and lifestyle or other factors, such as travel or where you work. ?What tests do I need? ?Screening ?Your health care provider may recommend screening tests for certain conditions. This may include: ?Lipid and cholesterol levels. ?Diabetes screening. This is done by checking your blood sugar (glucose) after you have not eaten for a while (fasting). ?Hepatitis B test. ?Hepatitis C test. ?HIV (human immunodeficiency virus) test. ?STI (sexually transmitted infection) testing, if you are at risk. ?Lung cancer screening. ?Prostate cancer screening. ?Colorectal cancer screening. ?Talk with your health care provider about your test results, treatment options, and if necessary, the need for more tests. ?Follow these instructions at home: ?Eating and drinking ? ?Eat a diet that includes fresh fruits and vegetables, whole grains, lean protein, and low-fat dairy products. ?Take vitamin and mineral supplements as recommended by your health care provider. ?Do not drink alcohol if your health care provider tells you not to drink. ?If you drink alcohol: ?Limit how much you have to 0-2 drinks a day. ?Know how much alcohol is in your drink. In the [place] one drink equals one 12 oz bottle of beer (355 mL), one 5 oz glass of wine (148 mL), or one 1? oz glass of hard liquor (44 mL). ?Lifestyle ?Brush your teeth every morning and night with fluoride toothpaste. Floss one  time each day. ?Exercise for at least 30 minutes 5 or more days each week. ?Do not use any products that contain nicotine or tobacco. These products include cigarettes, chewing tobacco, and vaping devices, such as e-cigarettes. If  you need help quitting, ask your health care provider. ?Do not use drugs. ?If you are sexually active, practice safe sex. Use a condom or other form of protection to prevent STIs. ?Take aspirin only as told by your health care provider. Make sure that you understand how much to take and what form to take. Work with your health care provider to find out whether it is safe and beneficial for you to take aspirin daily. ?Find healthy ways to manage stress, such as: ?Meditation, yoga, or listening to music. ?Journaling. ?Talking to a trusted person. ?Spending time with friends and family. ?Minimize exposure to UV radiation to reduce your risk of skin cancer. ?Safety ?Always wear your seat belt while driving or riding in a vehicle. ?Do not drive: ?If you have been drinking alcohol. Do not ride with someone who has been drinking. ?When you are tired or distracted. ?While texting. ?If you have been using any mind-altering substances or drugs. ?Wear a helmet and other protective equipment during sports activities. ?If you have firearms in your house, make sure you follow all gun safety procedures. ?What's next? ?Go to your health care provider once a year for an annual wellness visit. ?Ask your health care provider how often you should have your eyes and teeth checked. ?Stay up to date on all vaccines. ?This information is not intended to replace advice given to you by your health care provider. Make sure you discuss any questions you have with your health care provider. ?Document Revised: 01/15/2021 Document Reviewed: 01/15/2021 ?Elsevier Patient Education ? 2022 [employer]. ? ? ? ?

## 2021-10-08 DIAGNOSIS — F411 Generalized anxiety disorder: Secondary | ICD-10-CM | POA: Diagnosis not present

## 2021-10-08 DIAGNOSIS — F321 Major depressive disorder, single episode, moderate: Secondary | ICD-10-CM | POA: Diagnosis not present

## 2021-10-08 DIAGNOSIS — F4310 Post-traumatic stress disorder, unspecified: Secondary | ICD-10-CM | POA: Diagnosis not present

## 2021-10-15 DIAGNOSIS — F4310 Post-traumatic stress disorder, unspecified: Secondary | ICD-10-CM | POA: Diagnosis not present

## 2021-10-15 DIAGNOSIS — F321 Major depressive disorder, single episode, moderate: Secondary | ICD-10-CM | POA: Diagnosis not present

## 2021-10-15 DIAGNOSIS — F411 Generalized anxiety disorder: Secondary | ICD-10-CM | POA: Diagnosis not present

## 2021-10-22 DIAGNOSIS — F411 Generalized anxiety disorder: Secondary | ICD-10-CM | POA: Diagnosis not present

## 2021-10-22 DIAGNOSIS — F4310 Post-traumatic stress disorder, unspecified: Secondary | ICD-10-CM | POA: Diagnosis not present

## 2021-10-22 DIAGNOSIS — F321 Major depressive disorder, single episode, moderate: Secondary | ICD-10-CM | POA: Diagnosis not present

## 2021-10-23 ENCOUNTER — Other Ambulatory Visit: Payer: Self-pay

## 2021-10-23 ENCOUNTER — Ambulatory Visit (INDEPENDENT_AMBULATORY_CARE_PROVIDER_SITE_OTHER): Payer: 59 | Admitting: Behavioral Health

## 2021-10-23 ENCOUNTER — Encounter: Payer: Self-pay | Admitting: Behavioral Health

## 2021-10-23 ENCOUNTER — Other Ambulatory Visit (HOSPITAL_BASED_OUTPATIENT_CLINIC_OR_DEPARTMENT_OTHER): Payer: Self-pay

## 2021-10-23 DIAGNOSIS — F411 Generalized anxiety disorder: Secondary | ICD-10-CM

## 2021-10-23 DIAGNOSIS — F4321 Adjustment disorder with depressed mood: Secondary | ICD-10-CM

## 2021-10-23 DIAGNOSIS — Z634 Disappearance and death of family member: Secondary | ICD-10-CM

## 2021-10-23 DIAGNOSIS — F331 Major depressive disorder, recurrent, moderate: Secondary | ICD-10-CM | POA: Diagnosis not present

## 2021-10-23 MED ORDER — BUPROPION HCL ER (XL) 300 MG PO TB24
ORAL_TABLET | ORAL | 3 refills | Status: DC
  Filled 2021-10-23: qty 30, 30d supply, fill #0

## 2021-10-23 NOTE — Progress Notes (Signed)
[facility] ? ?Patient ID: Joshua Hansen,  ?MRN: [id number] ? ?PCP: [name], PA-C ? ?Date of Evaluation: 10/23/2021 ?Time spent:20 minutes ? ?Chief Complaint:  ?Chief Complaint   ?Anxiety; Depression; Trauma; Follow-up; Medication Refill ?  ? ? ?HISTORY/CURRENT STATUS: ?HPI ? ?41 year old male presents to this office for initial visit and to establish care. He is spanish speaking only and a certified [facility] interpreter is present with his consent. Collateral information is reliable and also is consistent with medical record notes. He is smiling and says that he is doing very well. He says his anxiety today is 0/10 and depression 0/10. He is sleeping 6-7 hours per night. No SI  since last visit. He says that he realizes his mistakes and does not want to die. Says that he loves his family too much. He verbally contracts for safety and assures me that he will take medication as prescribed. Agrees to f/u in 3 months. Denies mania, no psychosis. SI without a plan. No HI. ?  ?Prior psychiatric medication trial: ?Effexor ?Hydroxyzine ?  ? ? ? ?Individual Medical History/ Review of Systems: Changes? :No  ? ?Allergies: Patient has no known allergies. ? ?Current Medications:  ?Current Outpatient Medications:  ?  amLODipine (NORVASC) 5 MG tablet, Take 1 tablet by mouth once daily, Disp: 30 tablet, Rfl: 0 ?  buPROPion (WELLBUTRIN XL) 300 MG 24 hr tablet, Take 1 tablet by mouth once daily, Disp: 30 tablet, Rfl: 3 ?  cyclobenzaprine (FLEXERIL) 10 MG tablet, Take 1 tablet by mouth at bedtime as needed for neck pain or tension headache., Disp: 10 tablet, Rfl: 0 ?  diclofenac (VOLTAREN) 75 MG EC tablet, Take 1 tablet (75 mg total) by mouth 2 (two) times daily., Disp: 20 tablet, Rfl: 0 ?  lisinopril (ZESTRIL) 40 MG tablet, TAKE 1 TABLET (40 MG TOTAL) BY MOUTH DAILY., Disp: 90 tablet, Rfl: 3 ?  sildenafil (VIAGRA) 50 MG tablet, TAKE 1 TABLET (50 MG TOTAL) BY MOUTH DAILY AS NEEDED FOR ERECTILE  DYSFUNCTION., Disp: 10 tablet, Rfl: 0 ?Medication Side Effects: none ? ?Family Medical/ Social History: Changes? Yes  ? ?MENTAL HEALTH EXAM: ? ?There were no vitals taken for this visit.There is no height or weight on file to calculate BMI.  ?General Appearance: Casual and Neat  ?Eye Contact:  Good  ?Speech:  Clear and Coherent  ?Volume:  Normal  ?Mood:  NA  ?Affect:  Appropriate  ?Thought Process:  Coherent  ?Orientation:  Full (Time, Place, and Person)  ?Thought Content: Logical   ?Suicidal Thoughts:  No  ?Homicidal Thoughts:  No  ?Memory:  WNL  ?Judgement:  Good  ?Insight:  Good  ?Psychomotor Activity:  Normal  ?Concentration:  Concentration: Good  ?Recall:  Good  ?Fund of Knowledge: Good  ?Language: Good  ?Assets:  Desire for Improvement  ?ADL's:  Intact  ?Cognition: WNL  ?Prognosis:  Good  ? ? ?DIAGNOSES:  ?  ICD-10-CM   ?1. Generalized anxiety disorder  F41.1 buPROPion (WELLBUTRIN XL) 300 MG 24 hr tablet  ?  ?2. Major depressive disorder, recurrent episode, moderate (HCC)  F33.1 buPROPion (WELLBUTRIN XL) 300 MG 24 hr tablet  ?  ?3. Grief at loss of child  F43.21 buPROPion (WELLBUTRIN XL) 300 MG 24 hr tablet  ? Z63.4   ?  ? ? ?Receiving Psychotherapy: Yes  ? ? ?RECOMMENDATIONS:  ? ?Greater than 50% of 20  min face to face time with patient was spent on counseling and coordination of  care. He reports improvement with Wellbutrin alone at 300 mg. He feels very well with currently zero A&D. He does not want to try another AD at this time.  ?Today we agreed to: ?Continue Wellbutrin to 300 mg daily ?Continue the remaining Klonopin he has left only for severe anxiety. He reports having #20  tablets left.  ?Will report worsening symptoms promptly ?To follow up in 3 months reassess ?Provided emergency contact information ?Discussed potential benefits, risk, and side effects of benzodiazepines to include potential risk of tolerance and dependence, as well as possible drowsiness.  Advised patient not to drive if  experiencing drowsiness and to take lowest possible effective dose to minimize risk of dependence and tolerance.  ?Reviewed PDMP ? ? ? ? ? ?[name], NP  ?

## 2021-11-04 ENCOUNTER — Telehealth: Payer: Self-pay | Admitting: Behavioral Health

## 2021-11-04 DIAGNOSIS — M549 Dorsalgia, unspecified: Secondary | ICD-10-CM

## 2021-11-04 DIAGNOSIS — M79609 Pain in unspecified limb: Secondary | ICD-10-CM | POA: Insufficient documentation

## 2021-11-04 DIAGNOSIS — E291 Testicular hypofunction: Secondary | ICD-10-CM

## 2021-11-04 DIAGNOSIS — F329 Major depressive disorder, single episode, unspecified: Secondary | ICD-10-CM | POA: Insufficient documentation

## 2021-11-04 DIAGNOSIS — I10 Essential (primary) hypertension: Secondary | ICD-10-CM | POA: Insufficient documentation

## 2021-11-04 DIAGNOSIS — N529 Male erectile dysfunction, unspecified: Secondary | ICD-10-CM

## 2021-11-04 DIAGNOSIS — E559 Vitamin D deficiency, unspecified: Secondary | ICD-10-CM

## 2021-11-04 DIAGNOSIS — Z6830 Body mass index (BMI) 30.0-30.9, adult: Secondary | ICD-10-CM | POA: Insufficient documentation

## 2021-11-04 DIAGNOSIS — F419 Anxiety disorder, unspecified: Secondary | ICD-10-CM | POA: Insufficient documentation

## 2021-11-04 DIAGNOSIS — I00 Rheumatic fever without heart involvement: Secondary | ICD-10-CM | POA: Insufficient documentation

## 2021-11-04 DIAGNOSIS — Z8679 Personal history of other diseases of the circulatory system: Secondary | ICD-10-CM | POA: Insufficient documentation

## 2021-11-04 HISTORY — DX: Personal history of other diseases of the circulatory system: Z86.79

## 2021-11-04 HISTORY — DX: Pain in unspecified limb: M79.609

## 2021-11-04 HISTORY — DX: Vitamin D deficiency, unspecified: E55.9

## 2021-11-04 HISTORY — DX: Essential (primary) hypertension: I10

## 2021-11-04 HISTORY — DX: Male erectile dysfunction, unspecified: N52.9

## 2021-11-04 HISTORY — DX: Major depressive disorder, single episode, unspecified: F32.9

## 2021-11-04 HISTORY — DX: Dorsalgia, unspecified: M54.9

## 2021-11-04 HISTORY — DX: Testicular hypofunction: E29.1

## 2021-11-04 HISTORY — DX: Body mass index (BMI) 30.0-30.9, adult: Z68.30

## 2021-11-04 HISTORY — DX: Anxiety disorder, unspecified: F41.9

## 2021-11-04 NOTE — Telephone Encounter (Signed)
Wife called at 11:48 am and said that her husband Joshua Hansen needs  letter for an emotional support animal for his apartment. The letter needs to go to [facility] in [place]. Please address the letter to [facility]. They are moving on May 1st. The animal he has is a Public house manager. Please call him for pickup at [phone] ?

## 2021-11-05 NOTE — Telephone Encounter (Signed)
I am forwarding to both of you, as I'm not sure who would complete this form. Please advise if you need anything from me.  ?

## 2021-11-05 NOTE — Telephone Encounter (Signed)
Please advise Pt that it will be next week before this is completed, we will call when ready. ?

## 2021-11-05 NOTE — Telephone Encounter (Signed)
Wife notified.

## 2021-11-11 ENCOUNTER — Ambulatory Visit: Payer: 59 | Admitting: Cardiology

## 2021-11-13 ENCOUNTER — Telehealth: Payer: Self-pay | Admitting: Behavioral Health

## 2021-11-13 NOTE — Telephone Encounter (Signed)
Pt's wife called checking status on ESA letter. Called last week. Advised letter will be completed this week.  ?

## 2021-11-14 ENCOUNTER — Telehealth: Payer: Self-pay | Admitting: Behavioral Health

## 2021-11-14 NOTE — Telephone Encounter (Signed)
Pt notified letter ready to pick up. ?

## 2021-11-14 NOTE — Telephone Encounter (Signed)
Letter written and signed by Aaron Edelman ? ?

## 2021-11-25 ENCOUNTER — Other Ambulatory Visit (HOSPITAL_BASED_OUTPATIENT_CLINIC_OR_DEPARTMENT_OTHER): Payer: Self-pay

## 2021-11-25 ENCOUNTER — Other Ambulatory Visit: Payer: Self-pay | Admitting: Medical

## 2021-11-25 MED ORDER — SILDENAFIL CITRATE 50 MG PO TABS
ORAL_TABLET | ORAL | 0 refills | Status: DC
  Filled 2021-11-25: qty 10, 50d supply, fill #0

## 2021-11-25 MED ORDER — AMLODIPINE BESYLATE 5 MG PO TABS
ORAL_TABLET | ORAL | 0 refills | Status: DC
  Filled 2021-11-25: qty 30, 30d supply, fill #0

## 2021-11-26 DIAGNOSIS — F4310 Post-traumatic stress disorder, unspecified: Secondary | ICD-10-CM | POA: Diagnosis not present

## 2021-11-26 DIAGNOSIS — F411 Generalized anxiety disorder: Secondary | ICD-10-CM | POA: Diagnosis not present

## 2021-11-26 DIAGNOSIS — F321 Major depressive disorder, single episode, moderate: Secondary | ICD-10-CM | POA: Diagnosis not present

## 2021-12-12 DIAGNOSIS — F4310 Post-traumatic stress disorder, unspecified: Secondary | ICD-10-CM | POA: Diagnosis not present

## 2021-12-12 DIAGNOSIS — F411 Generalized anxiety disorder: Secondary | ICD-10-CM | POA: Diagnosis not present

## 2021-12-12 DIAGNOSIS — F321 Major depressive disorder, single episode, moderate: Secondary | ICD-10-CM | POA: Diagnosis not present

## 2021-12-18 DIAGNOSIS — F4310 Post-traumatic stress disorder, unspecified: Secondary | ICD-10-CM | POA: Diagnosis not present

## 2021-12-18 DIAGNOSIS — F411 Generalized anxiety disorder: Secondary | ICD-10-CM | POA: Diagnosis not present

## 2021-12-18 DIAGNOSIS — F321 Major depressive disorder, single episode, moderate: Secondary | ICD-10-CM | POA: Diagnosis not present

## 2021-12-25 ENCOUNTER — Other Ambulatory Visit: Payer: Self-pay | Admitting: Medical

## 2021-12-25 ENCOUNTER — Other Ambulatory Visit (HOSPITAL_BASED_OUTPATIENT_CLINIC_OR_DEPARTMENT_OTHER): Payer: Self-pay

## 2021-12-25 MED ORDER — SILDENAFIL CITRATE 50 MG PO TABS
ORAL_TABLET | ORAL | 0 refills | Status: DC
  Filled 2021-12-25: qty 10, 10d supply, fill #0

## 2021-12-25 MED ORDER — AMLODIPINE BESYLATE 5 MG PO TABS
ORAL_TABLET | ORAL | 0 refills | Status: DC
  Filled 2021-12-25: qty 30, 30d supply, fill #0

## 2021-12-25 MED ORDER — LISINOPRIL 40 MG PO TABS
ORAL_TABLET | Freq: Every day | ORAL | 3 refills | Status: DC
  Filled 2021-12-25: qty 90, 90d supply, fill #0
  Filled 2022-05-29: qty 90, 90d supply, fill #1
  Filled 2022-10-14: qty 90, 90d supply, fill #2

## 2022-01-01 DIAGNOSIS — F321 Major depressive disorder, single episode, moderate: Secondary | ICD-10-CM | POA: Diagnosis not present

## 2022-01-01 DIAGNOSIS — F4310 Post-traumatic stress disorder, unspecified: Secondary | ICD-10-CM | POA: Diagnosis not present

## 2022-01-01 DIAGNOSIS — F411 Generalized anxiety disorder: Secondary | ICD-10-CM | POA: Diagnosis not present

## 2022-01-15 DIAGNOSIS — F321 Major depressive disorder, single episode, moderate: Secondary | ICD-10-CM | POA: Diagnosis not present

## 2022-01-15 DIAGNOSIS — F4310 Post-traumatic stress disorder, unspecified: Secondary | ICD-10-CM | POA: Diagnosis not present

## 2022-01-15 DIAGNOSIS — F411 Generalized anxiety disorder: Secondary | ICD-10-CM | POA: Diagnosis not present

## 2022-01-29 ENCOUNTER — Other Ambulatory Visit (HOSPITAL_BASED_OUTPATIENT_CLINIC_OR_DEPARTMENT_OTHER): Payer: Self-pay

## 2022-01-29 ENCOUNTER — Telehealth (INDEPENDENT_AMBULATORY_CARE_PROVIDER_SITE_OTHER): Payer: 59 | Admitting: Behavioral Health

## 2022-01-29 ENCOUNTER — Encounter: Payer: Self-pay | Admitting: Behavioral Health

## 2022-01-29 DIAGNOSIS — F321 Major depressive disorder, single episode, moderate: Secondary | ICD-10-CM | POA: Diagnosis not present

## 2022-01-29 DIAGNOSIS — Z634 Disappearance and death of family member: Secondary | ICD-10-CM

## 2022-01-29 DIAGNOSIS — F4321 Adjustment disorder with depressed mood: Secondary | ICD-10-CM | POA: Diagnosis not present

## 2022-01-29 DIAGNOSIS — F4310 Post-traumatic stress disorder, unspecified: Secondary | ICD-10-CM

## 2022-01-29 DIAGNOSIS — F331 Major depressive disorder, recurrent, moderate: Secondary | ICD-10-CM

## 2022-01-29 DIAGNOSIS — F411 Generalized anxiety disorder: Secondary | ICD-10-CM

## 2022-01-29 MED ORDER — BUPROPION HCL ER (XL) 300 MG PO TB24
ORAL_TABLET | ORAL | 3 refills | Status: DC
  Filled 2022-01-29 – 2022-02-11 (×2): qty 30, 30d supply, fill #0
  Filled 2022-03-30: qty 30, 30d supply, fill #1

## 2022-01-29 MED ORDER — CLONAZEPAM 0.5 MG PO TABS
0.5000 mg | ORAL_TABLET | Freq: Every day | ORAL | 1 refills | Status: DC
  Filled 2022-01-29 – 2022-02-11 (×2): qty 30, 30d supply, fill #0
  Filled 2022-03-30: qty 30, 30d supply, fill #1

## 2022-01-29 NOTE — Progress Notes (Deleted)
[facility]  Patient ID: Joshua Hansen,  MRN: [id number]  PCP: Elise Benne  Date of Evaluation: 01/29/2022 Time spent:20 minutes  Chief Complaint:  Chief Complaint   Anxiety; Depression; Follow-up; Medication Refill     HISTORY/CURRENT STATUS: HPI  41 year old male presents via video visit for follow up and medication management. No interpreter with him this visit. English has improved. Speaking in simple sentences and slowly. Collateral information is reliable and also is consistent with medical record notes. He is smiling and says that he is doing very well. He says his anxiety today is 0/10 and depression 0/10. He is sleeping 6-7 hours per night. No SI  since last visit. He says that he realizes his mistakes and does not want to die. Says that he loves his family too much. He verbally contracts for safety and assures me that he will take medication as prescribed. Agrees to f/u in 3 months. Denies mania, no psychosis. SI without a plan. No HI.   Prior psychiatric medication trial: Effexor Hydroxyzine      Individual Medical History/ Review of Systems: Changes? :No   Allergies: Patient has no known allergies.  Current Medications:  Current Outpatient Medications:    clonazePAM (KLONOPIN) 0.5 MG tablet, Take 1 tablet (0.5 mg total) by mouth daily., Disp: 30 tablet, Rfl: 1   amLODipine (NORVASC) 5 MG tablet, Take 1 tablet by mouth once daily, Disp: 30 tablet, Rfl: 0   buPROPion (WELLBUTRIN XL) 300 MG 24 hr tablet, Take 1 tablet by mouth once daily, Disp: 30 tablet, Rfl: 3   cyclobenzaprine (FLEXERIL) 10 MG tablet, Take 1 tablet by mouth at bedtime as needed for neck pain or tension headache., Disp: 10 tablet, Rfl: 0   diclofenac (VOLTAREN) 75 MG EC tablet, Take 1 tablet (75 mg total) by mouth 2 (two) times daily., Disp: 20 tablet, Rfl: 0   lisinopril (ZESTRIL) 40 MG tablet, TAKE 1 TABLET (40 MG TOTAL) BY MOUTH DAILY., Disp: 90 tablet, Rfl: 3    sildenafil (VIAGRA) 50 MG tablet, TAKE 1 TABLET (50 MG TOTAL) BY MOUTH DAILY AS NEEDED FOR ERECTILE DYSFUNCTION., Disp: 10 tablet, Rfl: 0 Medication Side Effects: none  Family Medical/ Social History: Changes? No  MENTAL HEALTH EXAM:  There were no vitals taken for this visit.There is no height or weight on file to calculate BMI.  General Appearance: Casual, Neat, and Well Groomed  Eye Contact:  Good  Speech:  Clear and Coherent  Volume:  Normal  Mood:  Anxious  Affect:  Appropriate  Thought Process:  Coherent  Orientation:  Full (Time, Place, and Person)  Thought Content: Logical   Suicidal Thoughts:  No  Homicidal Thoughts:  No  Memory:  WNL  Judgement:  Good  Insight:  Good  Psychomotor Activity:  Normal  Concentration:  Concentration: Good  Recall:  Good  Fund of Knowledge: Good  Language: Good  Assets:  Desire for Improvement  ADL's:  Intact  Cognition: WNL  Prognosis:  Good    DIAGNOSES:    ICD-10-CM   1. Generalized anxiety disorder  F41.1 clonazePAM (KLONOPIN) 0.5 MG tablet    buPROPion (WELLBUTRIN XL) 300 MG 24 hr tablet    2. Major depressive disorder, recurrent episode, moderate (HCC)  F33.1 buPROPion (WELLBUTRIN XL) 300 MG 24 hr tablet    3. Grief at loss of child  F43.21 buPROPion (WELLBUTRIN XL) 300 MG 24 hr tablet   Z63.4       Receiving Psychotherapy:  yes   RECOMMENDATIONS:   Greater than 50% of 20  min face to face time with patient was spent on counseling and coordination of care. He reports improvement with Wellbutrin alone at 300 mg. He feels very well with currently zero A&D. He does not want to try another AD at this time.  Today we agreed to: Continue Wellbutrin to 300 mg daily Continue the remaining Klonopin he has left only for severe anxiety. He is not using everyday.  Will report worsening symptoms promptly To follow up in 3 months reassess Provided emergency contact information Discussed potential benefits, risk, and side effects  of benzodiazepines to include potential risk of tolerance and dependence, as well as possible drowsiness.  Advised patient not to drive if experiencing drowsiness and to take lowest possible effective dose to minimize risk of dependence and tolerance.  Reviewed PDMP      [name], NP

## 2022-02-04 ENCOUNTER — Ambulatory Visit: Payer: 59 | Admitting: Cardiology

## 2022-02-04 NOTE — Progress Notes (Signed)
Joshua Hansen Joshua Hansen [id number] 12-15-1980 41 y.o.  Virtual Visit via Video Note  I connected with pt @ on 01/29/22 at  3:30 PM EDT by a video enabled telemedicine application and verified that I am speaking with the correct person using two identifiers.   I discussed the limitations of evaluation and management by telemedicine and the availability of in person appointments. The patient expressed understanding and agreed to proceed.  I discussed the assessment and treatment plan with the patient. The patient was provided an opportunity to ask questions and all were answered. The patient agreed with the plan and demonstrated an understanding of the instructions.   The patient was advised to call back or seek an in-person evaluation if the symptoms worsen or if the condition fails to improve as anticipated.  I provided 20 minutes of non-face-to-face time during this encounter.  The patient was located at home.  The provider was located at [place].   [name], NP   Subjective:   Patient ID:  Joshua Hansen is a 41 y.o. (DOB 1981-06-26) male.  Chief Complaint:  Chief Complaint  Patient presents with   Anxiety   Depression   Follow-up   Medication Refill    HPI 41 year old male presents via video visit for follow up and medication management. No interpreter with him this visit. English has improved. Speaking in simple sentences and slowly. Collateral information is reliable and also is consistent with medical record notes. He is smiling and says that he is doing very well. He says his anxiety today is 0/10 and depression 0/10. He is sleeping 6-7 hours per night. No SI  since last visit. He says that he realizes his mistakes and does not want to die. Says that he loves his family too much. He verbally contracts for safety and assures me that he will take medication as prescribed. Agrees to f/u in 3 months. Denies mania, no psychosis. SI without a  plan. No HI.  Prior psychiatric medication trial: Effexor Hydroxyzine   Review of Systems:  Review of Systems  Musculoskeletal:  Negative for gait problem.  Allergic/Immunologic: Negative.   Neurological:  Negative for tremors.  Psychiatric/Behavioral:  The patient is nervous/anxious.     Medications: I have reviewed the patient's current medications.  Current Outpatient Medications  Medication Sig Dispense Refill   clonazePAM (KLONOPIN) 0.5 MG tablet Take 1 tablet (0.5 mg total) by mouth daily. 30 tablet 1   amLODipine (NORVASC) 5 MG tablet Take 1 tablet by mouth once daily 30 tablet 0   buPROPion (WELLBUTRIN XL) 300 MG 24 hr tablet Take 1 tablet by mouth once daily 30 tablet 3   cyclobenzaprine (FLEXERIL) 10 MG tablet Take 1 tablet by mouth at bedtime as needed for neck pain or tension headache. 10 tablet 0   diclofenac (VOLTAREN) 75 MG EC tablet Take 1 tablet (75 mg total) by mouth 2 (two) times daily. 20 tablet 0   lisinopril (ZESTRIL) 40 MG tablet TAKE 1 TABLET (40 MG TOTAL) BY MOUTH DAILY. 90 tablet 3   sildenafil (VIAGRA) 50 MG tablet TAKE 1 TABLET (50 MG TOTAL) BY MOUTH DAILY AS NEEDED FOR ERECTILE DYSFUNCTION. 10 tablet 0   No current facility-administered medications for this visit.    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   Essential hypertension 11/04/2021   Fracture of left humerus 08/07/2018   History of rheumatic fever 11/04/2021   Hypertension    Major depression, single episode 11/04/2021   Open fracture of left fibula and tibia 08/07/2018   Open fracture of left tibia 08/06/2018   Pain in limb 11/04/2021   Rheumatic fever    as child   Severe episode of recurrent major depressive disorder, without  psychotic features ([place]) 06/14/2021   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   Vitamin D deficiency 11/04/2021    Family History  Problem Relation Age of Onset   Alcohol abuse Maternal Uncle     Social History   Socioeconomic History   Marital status: Married    Spouse name: Ok Edwards   Number of children: 1   Years of education: 5th grade   Highest education level: Not on file  Occupational History   Occupation: Architect    Comment: Teaching laboratory technician  Tobacco Use   Smoking status: Never   Smokeless tobacco: Never  Vaping Use   Vaping Use: Never used  Substance and Sexual Activity   Alcohol use: Not Currently   Drug use: Never   Sexual activity: Yes  Other Topics Concern   Not on file  Social History Narrative   Lives  in [place] with friend.  Spouse asked to leave after his ER visit and overdose. 4 days in hospital and then to treatment facility in [facility] name unknown, but stayed 7 days.    Social Determinants of Health   Financial Resource Strain: Not on file  Food Insecurity: Not on file  Transportation Needs: Not on file  Physical Activity: Not on file  Stress: Not on file  Social Connections: Not on file  Intimate Partner Violence: Not on file    Past Medical History, Surgical history, Social history, and Family history were reviewed and updated as appropriate.   Please see review of systems for further details on the patient's review from today.   Objective:   Physical Exam:  There were no vitals taken for this visit.  Physical Exam Constitutional:      General: He is not in acute distress.    Appearance: Normal appearance.  Neurological:     Mental Status: He is alert and oriented to person, place, and time.     Gait: Gait normal.  Psychiatric:        Attention and Perception: Attention and perception normal. He does not perceive auditory or visual hallucinations.        Mood and Affect: Mood and affect normal. Mood is not anxious or depressed. Affect is not  labile.        Speech: Speech normal.        Behavior: Behavior normal. Behavior is cooperative.        Thought Content: Thought content normal.        Cognition and Memory: Cognition and memory normal.        Judgment: Judgment normal.     Lab Review:     Component Value Date/Time   NA 139 10/25/2020 1543   K 3.8 10/25/2020 1543   CL 104 10/25/2020 1543   CO2 22 10/25/2020 1543   GLUCOSE 79 10/25/2020 1543   BUN 12 10/25/2020 1543   CREATININE 1.01 10/25/2020 1543   CALCIUM 9.5 10/25/2020 1543   PROT 7.0 10/25/2020 1543   ALBUMIN 4.8 01/03/2020 1154   AST  14 10/25/2020 1543   ALT 17 10/25/2020 1543   ALKPHOS 80 01/03/2020 1154   BILITOT 0.5 10/25/2020 1543   GFRNONAA >60 10/20/2020 2228   GFRAA >60 08/07/2018 0211       Component Value Date/Time   WBC 9.6 10/20/2020 2228   RBC 5.40 10/20/2020 2228   HGB 16.1 10/20/2020 2228   HCT 48.7 10/20/2020 2228   PLT 307 10/20/2020 2228   MCV 90.2 10/20/2020 2228   MCH 29.8 10/20/2020 2228   MCHC 33.1 10/20/2020 2228   RDW 13.5 10/20/2020 2228   LYMPHSABS 3.5 10/20/2020 2228   MONOABS 1.0 10/20/2020 2228   EOSABS 0.0 10/20/2020 2228   BASOSABS 0.1 10/20/2020 2228    No results found for: "POCLITH", "LITHIUM"   No results found for: "PHENYTOIN", "PHENOBARB", "VALPROATE", "CBMZ"   .res Assessment: Plan:    Plan/Recommendations  Greater than 50% of 20  min video interview with patient was spent on counseling and coordination of care. He reports improvement with Wellbutrin alone at 300 mg. He feels very well with currently zero A&D. He does not want to try another AD at this time.  Today we agreed to: Continue Wellbutrin to 300 mg daily Continue the remaining Klonopin he has left only for severe anxiety. He is not using everyday.  Will report worsening symptoms promptly To follow up in 3 months reassess Provided emergency contact information Discussed potential benefits, risk, and side effects of benzodiazepines to  include potential risk of tolerance and dependence, as well as possible drowsiness.  Advised patient not to drive if experiencing drowsiness and to take lowest possible effective dose to minimize risk of dependence and tolerance.  Reviewed PDMP  Louanna Raw. [place], NP   Hayato was seen today for anxiety, depression, follow-up and medication refill.  Diagnoses and all orders for this visit:  Generalized anxiety disorder -     clonazePAM (KLONOPIN) 0.5 MG tablet; Take 1 tablet (0.5 mg total) by mouth daily. -     buPROPion (WELLBUTRIN XL) 300 MG 24 hr tablet; Take 1 tablet by mouth once daily  Major depressive disorder, recurrent episode, moderate (HCC) -     buPROPion (WELLBUTRIN XL) 300 MG 24 hr tablet; Take 1 tablet by mouth once daily  Grief at loss of child -     buPROPion (WELLBUTRIN XL) 300 MG 24 hr tablet; Take 1 tablet by mouth once daily  PTSD (post-traumatic stress disorder)     Please see After Visit Summary for patient specific instructions.  Future Appointments  Date Time Provider [place]  03/18/2022  3:40 PM Lelon Perla, MD [facility] None  05/01/2022  3:30 PM [name], NP [facility] None    No orders of the defined types were placed in this encounter.     -------------------------------

## 2022-02-06 ENCOUNTER — Encounter: Payer: Self-pay | Admitting: Medical

## 2022-02-06 ENCOUNTER — Other Ambulatory Visit (HOSPITAL_BASED_OUTPATIENT_CLINIC_OR_DEPARTMENT_OTHER): Payer: Self-pay

## 2022-02-06 ENCOUNTER — Other Ambulatory Visit: Payer: Self-pay | Admitting: Medical

## 2022-02-06 MED ORDER — AMLODIPINE BESYLATE 5 MG PO TABS
ORAL_TABLET | ORAL | 0 refills | Status: DC
Start: 2022-02-06 — End: 2022-03-30
  Filled 2022-02-06 – 2022-02-11 (×2): qty 30, 30d supply, fill #0

## 2022-02-09 ENCOUNTER — Other Ambulatory Visit (HOSPITAL_BASED_OUTPATIENT_CLINIC_OR_DEPARTMENT_OTHER): Payer: Self-pay

## 2022-02-11 ENCOUNTER — Other Ambulatory Visit (HOSPITAL_BASED_OUTPATIENT_CLINIC_OR_DEPARTMENT_OTHER): Payer: Self-pay

## 2022-02-11 ENCOUNTER — Ambulatory Visit: Payer: 59 | Admitting: Medical

## 2022-02-11 ENCOUNTER — Ambulatory Visit (HOSPITAL_BASED_OUTPATIENT_CLINIC_OR_DEPARTMENT_OTHER)
Admission: RE | Admit: 2022-02-11 | Discharge: 2022-02-11 | Disposition: A | Discharge status: Home / Self Care | Payer: 59 | Source: Ambulatory Visit | Attending: Medical | Admitting: Medical

## 2022-02-11 VITALS — BP 113/70 | HR 91 | Temp 98.2°F | Resp 18 | Ht 69.0 in | Wt 217.0 lb

## 2022-02-11 DIAGNOSIS — M79609 Pain in unspecified limb: Secondary | ICD-10-CM

## 2022-02-11 DIAGNOSIS — M79662 Pain in left lower leg: Secondary | ICD-10-CM | POA: Diagnosis not present

## 2022-02-11 DIAGNOSIS — M25562 Pain in left knee: Secondary | ICD-10-CM | POA: Diagnosis not present

## 2022-02-11 MED ORDER — DICLOFENAC SODIUM 75 MG PO TBEC
75.0000 mg | DELAYED_RELEASE_TABLET | Freq: Two times a day (BID) | ORAL | 0 refills | Status: DC
  Filled 2022-02-11: qty 20, 10d supply, fill #0

## 2022-02-11 NOTE — Progress Notes (Signed)
Subjective:    Patient ID: Joshua Hansen, male    DOB: [birth year], 41 y.o.   MRN: [id number]  HPI  Pt in with some left calf area pain, popliteal pain and some hamstring pain. Pain for 3 weeks. Sometimes pain will be present for 2 days then stops for 2 days only to return. At times when has pain level can reach level 8. Tylenol helps just a little.   Today is day with no pain.  No back pain associated with leg pain.  Review of Systems  Constitutional:  Negative for chills, fatigue and fever.  Respiratory:  Negative for cough, choking, chest tightness and wheezing.   Cardiovascular:  Negative for chest pain and palpitations.  Musculoskeletal:        See hpi. left calf area pain, popliteal pain and some hamstring pain.  Skin:  Negative for rash.  Hematological:  Negative for adenopathy. Does not bruise/bleed easily.    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   Essential hypertension 11/04/2021   Fracture of left humerus 08/07/2018   History of rheumatic fever 11/04/2021   Hypertension    Major depression, single episode 11/04/2021   Open fracture of left fibula and tibia 08/07/2018   Open fracture of left tibia 08/06/2018   Pain in limb 11/04/2021   Rheumatic fever    as child   Severe episode of recurrent major depressive disorder, without psychotic features ([place]) 06/14/2021   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   Vitamin D deficiency 11/04/2021     Social History   Socioeconomic History   Marital status: Married    Spouse name: Ok Edwards   Number of children: 1   Years of education: 5th grade   Highest education level: Not on file  Occupational History   Occupation: Architect    Comment: Teaching laboratory technician  Tobacco Use   Smoking status: Never   Smokeless tobacco: Never  Vaping Use    Vaping Use: Never used  Substance and Sexual Activity   Alcohol use: Not Currently   Drug use: Never   Sexual activity: Yes  Other Topics Concern   Not on file  Social History Narrative   Lives  in [place] with friend.  Spouse asked to leave after his ER visit and overdose. 4 days in hospital and then to treatment facility in [facility] name unknown, but stayed 7 days.    Social Determinants of Health   Financial Resource Strain: Not on file  Food Insecurity: Not on file  Transportation Needs: Not on file  Physical Activity: Not on file  Stress: Not on file  Social Connections: Not on file  Intimate Partner Violence: Not on file    Past Surgical History:  Procedure Laterality Date   I & D EXTREMITY Left 08/06/2018   Procedure: IRRIGATION AND [place];  Surgeon: Nicholes Stairs, MD;  Location: [facility];  Service: Orthopedics;  Laterality: Left;   TIBIA IM NAIL INSERTION Left 08/06/2018   Procedure: INTRAMEDULLARY (IM) NAIL TIBIAL;  Surgeon: Nicholes Stairs, MD;  Location: [facility];  Service: Orthopedics;  Laterality: Left;    Family History  Problem Relation Age of Onset   Alcohol abuse Maternal Uncle     No Known Allergies  Current Outpatient Medications on File Prior to Visit  Medication  Sig Dispense Refill   amLODipine (NORVASC) 5 MG tablet Take 1 tablet by mouth once daily 30 tablet 0   buPROPion (WELLBUTRIN XL) 300 MG 24 hr tablet Take 1 tablet by mouth once daily 30 tablet 3   clonazePAM (KLONOPIN) 0.5 MG tablet Take 1 tablet (0.5 mg total) by mouth daily. 30 tablet 1   cyclobenzaprine (FLEXERIL) 10 MG tablet Take 1 tablet by mouth at bedtime as needed for neck pain or tension headache. 10 tablet 0   diclofenac (VOLTAREN) 75 MG EC tablet Take 1 tablet (75 mg total) by mouth 2 (two) times daily. 20 tablet 0   lisinopril (ZESTRIL) 40 MG tablet TAKE 1 TABLET (40 MG TOTAL) BY MOUTH DAILY. 90 tablet 3   sildenafil (VIAGRA) 50 MG tablet TAKE 1 TABLET  (50 MG TOTAL) BY MOUTH DAILY AS NEEDED FOR ERECTILE DYSFUNCTION. 10 tablet 0   No current facility-administered medications on file prior to visit.    BP 113/70   Pulse 91   Temp 98.2 F (36.8 C)   Resp 18   Ht '5\' 9"'$  (1.753 m)   Wt 217 lb (98.4 kg)   SpO2 98%   BMI 32.05 kg/m        Objective:   Physical Exam   General- No acute distress. Pleasant patient. Neck- Full range of motion, no jvd Lungs- Clear, even and unlabored. Heart- regular rate and rhythm. Neurologic- CNII- XII grossly intact.  Lower ext- no pedal edema. Negative homans signs. No calf swelling on either side. Old scar scar distal left pretibial area but no pain on palpation. On exam and palpation notes when has pain will be in calf, popliteal and distal hamstring area left side.       Assessment & Plan:   Patient Instructions  Recent left calf, popliteal and hamstring pain. Present intermittent for 3 weeks. Will get left lower ext [place] to make sure no dvt. If no dvt then likely muscle type/connective tissue type pain.  If any back pain that is constant then get lumbar xray.  Rx diclofenac pending [place] results. If [place] negative and pain not resolving with diclofenac then consider sport med referral.  Follow up date to be determined after [place] review.     [name], PA-C

## 2022-02-11 NOTE — Patient Instructions (Addendum)
Recent left calf, popliteal and hamstring pain. Present intermittent for 3 weeks. Will get left lower ext [place] to make sure no dvt. If no dvt then likely muscle type/connective tissue type pain.  If any back pain that is constant then get lumbar xray.  Rx diclofenac pending [place] results. If [place] negative and pain not resolving with diclofenac then consider sport med referral.  Follow up date to be determined after [place] review.

## 2022-02-12 DIAGNOSIS — F411 Generalized anxiety disorder: Secondary | ICD-10-CM | POA: Diagnosis not present

## 2022-02-12 DIAGNOSIS — F321 Major depressive disorder, single episode, moderate: Secondary | ICD-10-CM | POA: Diagnosis not present

## 2022-02-12 DIAGNOSIS — F4310 Post-traumatic stress disorder, unspecified: Secondary | ICD-10-CM | POA: Diagnosis not present

## 2022-02-23 ENCOUNTER — Encounter: Payer: Self-pay | Admitting: Medical

## 2022-02-26 DIAGNOSIS — F411 Generalized anxiety disorder: Secondary | ICD-10-CM | POA: Diagnosis not present

## 2022-02-26 DIAGNOSIS — F321 Major depressive disorder, single episode, moderate: Secondary | ICD-10-CM | POA: Diagnosis not present

## 2022-02-26 DIAGNOSIS — F4310 Post-traumatic stress disorder, unspecified: Secondary | ICD-10-CM | POA: Diagnosis not present

## 2022-03-02 ENCOUNTER — Ambulatory Visit: Payer: 59 | Admitting: Cardiology

## 2022-03-05 ENCOUNTER — Encounter: Payer: Self-pay | Admitting: Cardiology

## 2022-03-05 NOTE — Progress Notes (Signed)
Referring-Edward Saguier PA-C Reason for referral-chest pain  HPI: 41 year old male for evaluation of chest pain at request of [employer].  Lower extremity venous Dopplers July 2023 showed no DVT.  Patient states that for the past 3 months he has had intermittent chest pain.  It is in the substernal and left upper chest area.  It is described as a pressure without radiation.  He states there is diaphoresis, dyspnea and nausea with the pain.  It is not exertional.  Not pleuritic or related to food.  He notes no dyspnea on exertion otherwise.  No orthopnea, PND, pedal edema or syncope.  Cardiology asked to evaluate.  Current Outpatient Medications  Medication Sig Dispense Refill   amLODipine (NORVASC) 5 MG tablet Take 1 tablet by mouth once daily 30 tablet 0   buPROPion (WELLBUTRIN XL) 300 MG 24 hr tablet Take 1 tablet by mouth once daily 30 tablet 3   clonazePAM (KLONOPIN) 0.5 MG tablet Take 1 tablet (0.5 mg total) by mouth daily. 30 tablet 1   cyclobenzaprine (FLEXERIL) 10 MG tablet Take 1 tablet by mouth at bedtime as needed for neck pain or tension headache. 10 tablet 0   diclofenac (VOLTAREN) 75 MG EC tablet Take 1 tablet (75 mg total) by mouth 2 (two) times daily. 20 tablet 0   lisinopril (ZESTRIL) 40 MG tablet TAKE 1 TABLET (40 MG TOTAL) BY MOUTH DAILY. 90 tablet 3   sildenafil (VIAGRA) 50 MG tablet TAKE 1 TABLET (50 MG TOTAL) BY MOUTH DAILY AS NEEDED FOR ERECTILE DYSFUNCTION. 10 tablet 0   No current facility-administered medications for this visit.    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   Essential hypertension 11/04/2021   Fracture of left humerus 08/07/2018   History of rheumatic fever 11/04/2021   Major depression, single episode  11/04/2021   Open fracture of left fibula and tibia 08/07/2018   Open fracture of left tibia 08/06/2018   Pain in limb 11/04/2021   Rheumatic fever    as child   Severe episode of recurrent major depressive disorder, without psychotic features ([place]) 06/14/2021   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   Vitamin D deficiency 11/04/2021    Past Surgical History:  Procedure Laterality Date   I & D EXTREMITY Left 08/06/2018   Procedure: IRRIGATION AND DEBRIDEMENT EXTREMITY;  Surgeon: Nicholes Stairs, MD;  Location: [facility];  Service: Orthopedics;  Laterality: Left;   TIBIA IM NAIL INSERTION Left 08/06/2018   Procedure: INTRAMEDULLARY (IM) NAIL TIBIAL;  Surgeon: Nicholes Stairs, MD;  Location: [facility];  Service: Orthopedics;  Laterality: Left;    Social History   Socioeconomic History   Marital status: Married    Spouse name: Ok Edwards   Number of children: 1   Years of education: 5th grade   Highest education level: Not on file  Occupational History   Occupation: Architect    Comment: Teaching laboratory technician  Tobacco Use   Smoking status: Former    Types: Cigarettes   Smokeless tobacco: Never  Scientific laboratory technician Use: Never used  Substance and Sexual Activity   Alcohol use: Never   Drug use: Never   Sexual activity: Yes  Other Topics Concern   Not on file  Social History Narrative   Lives  in [facility] with friend.  Spouse asked to leave after his ER visit and overdose. 4 days in hospital and then to treatment facility in [facility] name unknown, but stayed 7 days.    Social Determinants of Health   Financial Resource Strain: Not on file  Food Insecurity: Not on file  Transportation Needs: Not on file  Physical Activity: Not on file  Stress: Not on file  Social Connections: Not on file  Intimate Partner Violence: Not on file    Family History  Problem Relation Age of Onset   Hyperlipidemia Mother    Hypertension Mother    Alcohol abuse Maternal Uncle     ROS: no fevers or  chills, productive cough, hemoptysis, dysphasia, odynophagia, melena, hematochezia, dysuria, hematuria, rash, seizure activity, orthopnea, PND, pedal edema, claudication. Remaining systems are negative.  Physical Exam:   Blood pressure (!) 142/78, pulse 73, height '5\' 9"'$  (1.753 m), weight 217 lb 6.4 oz (98.6 kg), SpO2 97 %.  General:  Well developed/well nourished in NAD Skin warm/dry Patient not depressed No peripheral clubbing Back-normal HEENT-normal/normal eyelids Neck supple/normal carotid upstroke bilaterally; no bruits; no JVD; no thyromegaly chest - CTA/ normal expansion CV - RRR/normal S1 and S2; no murmurs, rubs or gallops;  PMI nondisplaced Abdomen -NT/ND, no HSM, no mass, + bowel sounds, no bruit 2+ femoral pulses, no bruits Ext-no edema, chords, 2+ DP Neuro-grossly nonfocal  ECG -normal sinus rhythm at a rate of 73, left ventricular hypertrophy.  Personally reviewed  A/P  1 chest pain-symptoms are atypical.  I will arrange a cardiac CTA to rule out obstructive coronary disease.  2 history of rheumatic fever-we will arrange echocardiogram to rule out mitral valve disease.  3 hypertension-patient's blood pressure is mildly elevated.  Follow and advance medications as needed.  Kirk Ruths, MD

## 2022-03-12 DIAGNOSIS — F411 Generalized anxiety disorder: Secondary | ICD-10-CM | POA: Diagnosis not present

## 2022-03-12 DIAGNOSIS — F321 Major depressive disorder, single episode, moderate: Secondary | ICD-10-CM | POA: Diagnosis not present

## 2022-03-12 DIAGNOSIS — F4310 Post-traumatic stress disorder, unspecified: Secondary | ICD-10-CM | POA: Diagnosis not present

## 2022-03-18 ENCOUNTER — Other Ambulatory Visit (HOSPITAL_BASED_OUTPATIENT_CLINIC_OR_DEPARTMENT_OTHER): Payer: Self-pay

## 2022-03-18 ENCOUNTER — Ambulatory Visit (INDEPENDENT_AMBULATORY_CARE_PROVIDER_SITE_OTHER): Payer: 59 | Admitting: Cardiology

## 2022-03-18 ENCOUNTER — Encounter: Payer: Self-pay | Admitting: Cardiology

## 2022-03-18 VITALS — BP 142/78 | HR 73 | Ht 69.0 in | Wt 217.4 lb

## 2022-03-18 DIAGNOSIS — I00 Rheumatic fever without heart involvement: Secondary | ICD-10-CM | POA: Diagnosis not present

## 2022-03-18 DIAGNOSIS — R072 Precordial pain: Secondary | ICD-10-CM

## 2022-03-18 DIAGNOSIS — I10 Essential (primary) hypertension: Secondary | ICD-10-CM | POA: Diagnosis not present

## 2022-03-18 MED ORDER — METOPROLOL TARTRATE 100 MG PO TABS
100.0000 mg | ORAL_TABLET | ORAL | 0 refills | Status: DC
  Filled 2022-03-18: qty 1, 1d supply, fill #0

## 2022-03-18 NOTE — Patient Instructions (Addendum)
Medication Instructions:   Your physician recommends that you continue on your current medications as directed. Please refer to the Current Medication list given to you today.   *If you need a refill on your cardiac medications before your next appointment, please call your pharmacy*   Lab Work:   TODAY!!!! BMET  If you have labs (blood work) drawn today and your tests are completely normal, you will receive your results only by: [place] (if you have MyChart) OR A paper copy in the mail If you have any lab test that is abnormal or we need to change your treatment, we will call you to review the results.   Testing/Procedures: Your physician has requested that you have an echocardiogram. Echocardiography is a painless test that uses sound waves to create images of your heart. It provides your doctor with information about the size and shape of your heart and how well your heart's chambers and valves are working. This procedure takes approximately one hour. There are no restrictions for this procedure.    Your cardiac CT will be scheduled at one of the below locations:   [facility] [address] [place], [place] [address] [phone]  If scheduled at [facility], please arrive at the [facility] (Entrance C2) of [facility] 30 minutes prior to test start time. You can use the FREE valet parking offered at entrance C (encouraged to control the heart rate for the test)  Proceed to the [facility] Radiology Department (first floor) to check-in and test prep.  All radiology patients and guests should use entrance C2 at [facility], accessed from [facility], even though the hospital's physical address listed is [address]    IPlease follow these instructions carefully (unless otherwise directed):  Hold all erectile dysfunction medications at least 3 days (72 hrs) prior to test.  On the  Night Before the Test: Be sure to Drink plenty of water. Do not consume any caffeinated/decaffeinated beverages or chocolate 12 hours prior to your test. Do not take any antihistamines 12 hours prior to your test.   On the Day of the Test: Drink plenty of water until 1 hour prior to the test. Do not eat any food 4 hours prior to the test. You may take your regular medications prior to the test.  Take metoprolol (Lopressor) two hours prior to test.     After the Test: Drink plenty of water. After receiving IV contrast, you may experience a mild flushed feeling. This is normal. On occasion, you may experience a mild rash up to 24 hours after the test. This is not dangerous. If this occurs, you can take Benadryl 25 mg and increase your fluid intake. If you experience trouble breathing, this can be serious. If it is severe call 911 IMMEDIATELY. If it is mild, please call our office.   We will call to schedule your test 2-4 weeks out understanding that some insurance companies will need an authorization prior to the service being performed.   For non-scheduling related questions, please contact the cardiac imaging nurse navigator should you have any questions/concerns: Marchia Bond, Cardiac Imaging Nurse Navigator Gordy Clement, Cardiac Imaging Nurse Navigator [facility] Direct Office Dial: [phone]   For scheduling needs, including cancellations and rescheduling, please call [place], [phone].    Follow-Up: At [facility], you and your health needs are our priority.  As part of our continuing mission to  provide you with exceptional heart care, we have created designated Provider Care Teams.  These Care Teams include your primary Cardiologist (physician) and Advanced Practice Providers (APPs -  Physician Assistants and Nurse Practitioners) who all work together to provide you with the care you need, when you need it.  We recommend signing up for  the patient portal called "MyChart".  Sign up information is provided on this After Visit Summary.  MyChart is used to connect with patients for Virtual Visits (Telemedicine).  Patients are able to view lab/test results, encounter notes, upcoming appointments, etc.  Non-urgent messages can be sent to your provider as well.   To learn more about what you can do with MyChart, go to [email or web address].    Your next appointment:   As needed  The format for your next appointment:   In Person  Provider:   Kirk Ruths, MD    Important Information About Sugar

## 2022-03-25 ENCOUNTER — Other Ambulatory Visit (HOSPITAL_BASED_OUTPATIENT_CLINIC_OR_DEPARTMENT_OTHER): Payer: Self-pay

## 2022-03-26 DIAGNOSIS — F4310 Post-traumatic stress disorder, unspecified: Secondary | ICD-10-CM | POA: Diagnosis not present

## 2022-03-26 DIAGNOSIS — F321 Major depressive disorder, single episode, moderate: Secondary | ICD-10-CM | POA: Diagnosis not present

## 2022-03-26 DIAGNOSIS — F411 Generalized anxiety disorder: Secondary | ICD-10-CM | POA: Diagnosis not present

## 2022-03-30 ENCOUNTER — Other Ambulatory Visit: Payer: Self-pay | Admitting: Medical

## 2022-03-31 ENCOUNTER — Other Ambulatory Visit (HOSPITAL_BASED_OUTPATIENT_CLINIC_OR_DEPARTMENT_OTHER): Payer: Self-pay

## 2022-03-31 ENCOUNTER — Ambulatory Visit (HOSPITAL_BASED_OUTPATIENT_CLINIC_OR_DEPARTMENT_OTHER): Admission: RE | Admit: 2022-03-31 | Payer: 59 | Source: Ambulatory Visit

## 2022-03-31 MED ORDER — AMLODIPINE BESYLATE 5 MG PO TABS
5.0000 mg | ORAL_TABLET | Freq: Every day | ORAL | 0 refills | Status: DC
  Filled 2022-03-31: qty 90, 90d supply, fill #0

## 2022-04-02 ENCOUNTER — Ambulatory Visit (HOSPITAL_BASED_OUTPATIENT_CLINIC_OR_DEPARTMENT_OTHER)
Admission: RE | Admit: 2022-04-02 | Discharge: 2022-04-02 | Disposition: A | Discharge status: Home / Self Care | Payer: 59 | Source: Ambulatory Visit | Attending: Cardiology | Admitting: Cardiology

## 2022-04-02 DIAGNOSIS — I10 Essential (primary) hypertension: Secondary | ICD-10-CM | POA: Diagnosis not present

## 2022-04-02 DIAGNOSIS — R072 Precordial pain: Secondary | ICD-10-CM | POA: Insufficient documentation

## 2022-04-02 DIAGNOSIS — I00 Rheumatic fever without heart involvement: Secondary | ICD-10-CM | POA: Insufficient documentation

## 2022-04-02 NOTE — Progress Notes (Signed)
  Echocardiogram 2D Echocardiogram has been performed.  Joshua Hansen 04/02/2022, 5:11 PM

## 2022-04-03 DIAGNOSIS — F411 Generalized anxiety disorder: Secondary | ICD-10-CM | POA: Diagnosis not present

## 2022-04-03 DIAGNOSIS — F4310 Post-traumatic stress disorder, unspecified: Secondary | ICD-10-CM | POA: Diagnosis not present

## 2022-04-03 DIAGNOSIS — F321 Major depressive disorder, single episode, moderate: Secondary | ICD-10-CM | POA: Diagnosis not present

## 2022-04-03 LAB — ECHOCARDIOGRAM COMPLETE
AR max vel: 2.88 cm2
AV Area VTI: 2.98 cm2
AV Area mean vel: 2.85 cm2
AV Mean grad: 3 mmHg
AV Peak grad: 6.7 mmHg
Ao pk vel: 1.29 m/s
Area-P 1/2: 4.6 cm2
S' Lateral: 2.9 cm

## 2022-04-09 ENCOUNTER — Telehealth: Payer: Self-pay | Admitting: Cardiology

## 2022-04-09 ENCOUNTER — Telehealth (HOSPITAL_COMMUNITY): Payer: Self-pay | Admitting: Emergency Medicine

## 2022-04-09 NOTE — Telephone Encounter (Signed)
Attempted to call patient regarding upcoming cardiac CT appointment. °Left message on voicemail with name and callback number °Gottfried Standish RN Navigator Cardiac Imaging °[facility] °[phone] Office °[phone] Cell ° °

## 2022-04-09 NOTE — Telephone Encounter (Signed)
Pt wife calling stating that pt was denied to get labs done at [facility] in [place] due to insurance. Advised pt to go to second floor with primary care, [facility]. Pt needs to reschedule CT after labs completed.

## 2022-04-10 ENCOUNTER — Ambulatory Visit (HOSPITAL_COMMUNITY): Admission: RE | Admit: 2022-04-10 | Payer: 59 | Source: Ambulatory Visit

## 2022-04-10 DIAGNOSIS — F411 Generalized anxiety disorder: Secondary | ICD-10-CM | POA: Diagnosis not present

## 2022-04-10 DIAGNOSIS — F4310 Post-traumatic stress disorder, unspecified: Secondary | ICD-10-CM | POA: Diagnosis not present

## 2022-04-10 DIAGNOSIS — F321 Major depressive disorder, single episode, moderate: Secondary | ICD-10-CM | POA: Diagnosis not present

## 2022-04-14 DIAGNOSIS — F321 Major depressive disorder, single episode, moderate: Secondary | ICD-10-CM | POA: Diagnosis not present

## 2022-04-14 DIAGNOSIS — F4310 Post-traumatic stress disorder, unspecified: Secondary | ICD-10-CM | POA: Diagnosis not present

## 2022-04-14 DIAGNOSIS — F411 Generalized anxiety disorder: Secondary | ICD-10-CM | POA: Diagnosis not present

## 2022-04-15 ENCOUNTER — Telehealth: Payer: Self-pay | Admitting: Behavioral Health

## 2022-04-15 NOTE — Telephone Encounter (Signed)
Wife called this morning at 10:30 to request letter recommending a support animal.  They have been approved for the apartment at [facility], but they want the letter from the dr. Before they move in.  The animal he has is a Doberman.  They will pick up the letter.  Please call them when ready.

## 2022-04-17 DIAGNOSIS — F321 Major depressive disorder, single episode, moderate: Secondary | ICD-10-CM | POA: Diagnosis not present

## 2022-04-17 DIAGNOSIS — F4310 Post-traumatic stress disorder, unspecified: Secondary | ICD-10-CM | POA: Diagnosis not present

## 2022-04-17 DIAGNOSIS — F411 Generalized anxiety disorder: Secondary | ICD-10-CM | POA: Diagnosis not present

## 2022-04-24 DIAGNOSIS — F321 Major depressive disorder, single episode, moderate: Secondary | ICD-10-CM | POA: Diagnosis not present

## 2022-04-24 DIAGNOSIS — F411 Generalized anxiety disorder: Secondary | ICD-10-CM | POA: Diagnosis not present

## 2022-04-24 DIAGNOSIS — F4310 Post-traumatic stress disorder, unspecified: Secondary | ICD-10-CM | POA: Diagnosis not present

## 2022-05-01 ENCOUNTER — Other Ambulatory Visit (HOSPITAL_BASED_OUTPATIENT_CLINIC_OR_DEPARTMENT_OTHER): Payer: Self-pay

## 2022-05-01 ENCOUNTER — Telehealth: Payer: 59 | Admitting: Behavioral Health

## 2022-05-01 DIAGNOSIS — F411 Generalized anxiety disorder: Secondary | ICD-10-CM

## 2022-05-01 DIAGNOSIS — F4310 Post-traumatic stress disorder, unspecified: Secondary | ICD-10-CM | POA: Diagnosis not present

## 2022-05-01 DIAGNOSIS — F331 Major depressive disorder, recurrent, moderate: Secondary | ICD-10-CM

## 2022-05-01 DIAGNOSIS — F321 Major depressive disorder, single episode, moderate: Secondary | ICD-10-CM | POA: Diagnosis not present

## 2022-05-01 DIAGNOSIS — F4321 Adjustment disorder with depressed mood: Secondary | ICD-10-CM

## 2022-05-01 MED ORDER — BUPROPION HCL ER (XL) 300 MG PO TB24
300.0000 mg | ORAL_TABLET | Freq: Every day | ORAL | 3 refills | Status: DC
  Filled 2022-05-01: qty 30, 30d supply, fill #0
  Filled 2022-05-29: qty 30, 30d supply, fill #1

## 2022-05-01 MED ORDER — CLONAZEPAM 0.5 MG PO TABS
0.5000 mg | ORAL_TABLET | Freq: Every day | ORAL | 2 refills | Status: DC
  Filled 2022-05-01: qty 30, 30d supply, fill #0
  Filled 2022-05-29: qty 30, 30d supply, fill #1

## 2022-05-01 NOTE — Progress Notes (Signed)
Contacted patient. He will call to reschedule appt. Refills sent.

## 2022-05-05 ENCOUNTER — Ambulatory Visit: Payer: 59 | Admitting: Medical

## 2022-05-05 ENCOUNTER — Other Ambulatory Visit (HOSPITAL_BASED_OUTPATIENT_CLINIC_OR_DEPARTMENT_OTHER): Payer: Self-pay

## 2022-05-05 ENCOUNTER — Ambulatory Visit (HOSPITAL_BASED_OUTPATIENT_CLINIC_OR_DEPARTMENT_OTHER)
Admission: RE | Admit: 2022-05-05 | Discharge: 2022-05-05 | Disposition: A | Discharge status: Home / Self Care | Payer: 59 | Source: Ambulatory Visit | Attending: Medical | Admitting: Medical

## 2022-05-05 ENCOUNTER — Encounter: Payer: Self-pay | Admitting: Medical

## 2022-05-05 VITALS — BP 135/80 | HR 84 | Temp 98.0°F | Resp 18 | Ht 69.0 in | Wt 208.6 lb

## 2022-05-05 DIAGNOSIS — M79605 Pain in left leg: Secondary | ICD-10-CM

## 2022-05-05 DIAGNOSIS — Z9889 Other specified postprocedural states: Secondary | ICD-10-CM | POA: Diagnosis not present

## 2022-05-05 DIAGNOSIS — M545 Low back pain, unspecified: Secondary | ICD-10-CM | POA: Diagnosis not present

## 2022-05-05 DIAGNOSIS — M25551 Pain in right hip: Secondary | ICD-10-CM

## 2022-05-05 DIAGNOSIS — M4726 Other spondylosis with radiculopathy, lumbar region: Secondary | ICD-10-CM | POA: Diagnosis not present

## 2022-05-05 DIAGNOSIS — S82832A Other fracture of upper and lower end of left fibula, initial encounter for closed fracture: Secondary | ICD-10-CM | POA: Diagnosis not present

## 2022-05-05 DIAGNOSIS — G35 Multiple sclerosis: Secondary | ICD-10-CM | POA: Diagnosis not present

## 2022-05-05 MED ORDER — KETOROLAC TROMETHAMINE 60 MG/2ML IM SOLN
60.0000 mg | Freq: Once | INTRAMUSCULAR | Status: AC
  Administered 2022-05-05: 60 mg via INTRAMUSCULAR

## 2022-05-05 MED ORDER — DICLOFENAC SODIUM 75 MG PO TBEC
75.0000 mg | DELAYED_RELEASE_TABLET | Freq: Two times a day (BID) | ORAL | 0 refills | Status: DC
  Filled 2022-05-05: qty 20, 10d supply, fill #0

## 2022-05-05 NOTE — Progress Notes (Signed)
Subjective:    Patient ID: Joshua Hansen, male    DOB: [birth year], 41 y.o.   MRN: [id number]  HPI  Pt in with rt hip pain and numbness to rt hip, rt peroneal region and to rt great to mid foot area.  Symptoms for 2 weeks.  No heavy belt at work.  No accident or fall. On review I don't see any lumbar xray but do see procedure.   CLINICAL DATA:  Chronic low back pain. Right S1 radiculopathy. Displacement of the L5-S1 lumbar disc.  On review some mild low back pain about 2 weeks ago but only lasted for 2 days. States pain was about 5/10 at that time.  Htn- bp better controlled on recheck. On amlodipine and lisinopril.   Review of Systems  Constitutional:  Negative for chills, fatigue and fever.  Respiratory:  Negative for cough, chest tightness, shortness of breath and wheezing.   Cardiovascular:  Negative for chest pain and palpitations.  Gastrointestinal:  Negative for abdominal pain, constipation and diarrhea.  Genitourinary:  Negative for dysuria.  Musculoskeletal:  Positive for back pain. Negative for joint swelling.  Skin:  Negative for rash.  Neurological:  Negative for dizziness, numbness and headaches.  Hematological:  Negative for adenopathy. Does not bruise/bleed easily.  Psychiatric/Behavioral:  Negative for behavioral problems, dysphoric mood and sleep disturbance.     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   Essential hypertension 11/04/2021   Fracture of left humerus 08/07/2018   History of rheumatic fever 11/04/2021   Major depression, single episode 11/04/2021   Open fracture of left fibula and tibia 08/07/2018   Open fracture of left tibia 08/06/2018   Pain in limb 11/04/2021   Rheumatic fever    as child   Severe episode of recurrent  major depressive disorder, without psychotic features ([place]) 06/14/2021   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   Vitamin D deficiency 11/04/2021     Social History   Socioeconomic History   Marital status: Married    Spouse name: Ok Edwards   Number of children: 1   Years of education: 5th grade   Highest education level: Not on file  Occupational History   Occupation: Architect    Comment: Teaching laboratory technician  Tobacco Use   Smoking status: Former    Types: Cigarettes   Smokeless tobacco: Never  Vaping Use   Vaping Use: Never used  Substance and Sexual Activity   Alcohol use: Never   Drug use: Never   Sexual activity: Yes  Other Topics Concern   Not on file  Social History Narrative   Lives  in [place] with friend.  Spouse asked to leave after his ER visit and overdose. 4 days in hospital and then to treatment facility in [facility] name unknown, but stayed 7 days.    Social Determinants of Health   Financial Resource Strain: Not on file  Food Insecurity: Not on file  Transportation Needs: Not on file  Physical Activity: Not on file  Stress: Not on file  Social Connections: Not on file  Intimate Partner Violence: Not on file    Past Surgical History:  Procedure Laterality Date   I & D EXTREMITY Left 08/06/2018   Procedure: IRRIGATION AND [place];  Surgeon: Nicholes Stairs, MD;  Location: [place];  Service: Orthopedics;  Laterality: Left;   TIBIA IM NAIL INSERTION Left 08/06/2018   Procedure: INTRAMEDULLARY (IM) NAIL TIBIAL;  Surgeon: Nicholes Stairs, MD;  Location: [place];  Service: Orthopedics;  Laterality: Left;    Family History  Problem Relation Age of Onset   Hyperlipidemia Mother    Hypertension Mother    Alcohol abuse Maternal Uncle     No Known Allergies  Current Outpatient Medications on File Prior to Visit  Medication Sig Dispense Refill   amLODipine (NORVASC) 5 MG tablet Take 1 tablet (5 mg total) by mouth daily. 90 tablet 0   buPROPion  (WELLBUTRIN XL) 300 MG 24 hr tablet Take 1 tablet (300 mg total) by mouth daily. 30 tablet 3   clonazePAM (KLONOPIN) 0.5 MG tablet Take 1 tablet by mouth daily. 30 tablet 2   cyclobenzaprine (FLEXERIL) 10 MG tablet Take 1 tablet by mouth at bedtime as needed for neck pain or tension headache. 10 tablet 0   diclofenac (VOLTAREN) 75 MG EC tablet Take 1 tablet (75 mg total) by mouth 2 (two) times daily. 20 tablet 0   lisinopril (ZESTRIL) 40 MG tablet TAKE 1 TABLET (40 MG TOTAL) BY MOUTH DAILY. 90 tablet 3   metoprolol tartrate (LOPRESSOR) 100 MG tablet Take 1 tablet (100 mg total) by mouth as directed. 2 hours prior to CT scan. 1 tablet 0   sildenafil (VIAGRA) 50 MG tablet TAKE 1 TABLET (50 MG TOTAL) BY MOUTH DAILY AS NEEDED FOR ERECTILE DYSFUNCTION. 10 tablet 0   No current facility-administered medications on file prior to visit.    BP 135/80   Pulse 84   Temp 98 F (36.7 C)   Resp 18   Ht '5\' 9"'$  (1.753 m)   Wt 208 lb 9.6 oz (94.6 kg)   SpO2 98%   BMI 30.80 kg/m        Objective:   Physical Exam  General Appearance- Not in acute distress.    Chest and Lung Exam Auscultation: Breath sounds:-Normal. Clear even and unlabored. Adventitious sounds:- No Adventitious sounds.  Cardiovascular Auscultation:Rythm - Regular, rate and rythm. Heart Sounds -Normal heart sounds.  Abdomen Inspection:-Inspection Normal.  Palpation/Perucssion: Palpation and Percussion of the abdomen reveal- Non Tender, No Rebound tenderness, No rigidity(Guarding) and No Palpable abdominal masses.  Liver:-Normal.  Spleen:- Normal.   Back No Mid lumbar spine tenderness to palpation. Pain on straight leg lift. Pain on lateral movements and flexion/extension of the spine.  Lower ext neurologic  L5-S1 sensation intact bilaterally. Normal patellar reflexes bilaterally. No foot drop bilaterally.   Rt hip- mild pain on palpation    Assessment & Plan:   Patient Instructions  Moderate level 5/10 pain 2  weeks ago but only 2 days. Now some pain in rt lower ext that might indicate l4-l5 region issue.   Rt hip pain level at time 8/10.   Will get lumbar spine xray. Also include rt hip xray due to pain today on exam.  Toradol 60 mg IM.   Can restart diclofenac 75 mg twice daily.  Htn- bp better controlled on recheck. Continue current bp meds.  Ask you update me by my chart tomorrow as to if pain level decreased. After xray review and respose to treatment will decide if need to refer to neurosurgeon or sports med MD.   [name], PA-C

## 2022-05-05 NOTE — Patient Instructions (Addendum)
Moderate level 5/10 pain 2 weeks ago but only 2 days. Now some pain in rt lower ext that might indicate l4-l5 region issue.   Rt hip pain level at time 8/10.   Will get lumbar spine xray. Also include rt hip xray due to pain today on exam.  Toradol 60 mg IM.   Can restart diclofenac 75 mg twice daily.  Htn- bp better controlled on recheck. Continue current bp meds.  Ask you update me by my chart tomorrow as to if pain level decreased. After xray review and response to treatment will decide if need to refer to neurosurgeon or sports med MD.

## 2022-05-05 NOTE — Addendum Note (Signed)
Addended by: Jeronimo Greaves on: 05/05/2022 03:37 PM   Modules accepted: Orders

## 2022-05-08 DIAGNOSIS — F321 Major depressive disorder, single episode, moderate: Secondary | ICD-10-CM | POA: Diagnosis not present

## 2022-05-08 DIAGNOSIS — F4310 Post-traumatic stress disorder, unspecified: Secondary | ICD-10-CM | POA: Diagnosis not present

## 2022-05-08 DIAGNOSIS — F411 Generalized anxiety disorder: Secondary | ICD-10-CM | POA: Diagnosis not present

## 2022-05-22 DIAGNOSIS — F321 Major depressive disorder, single episode, moderate: Secondary | ICD-10-CM | POA: Diagnosis not present

## 2022-05-22 DIAGNOSIS — F4310 Post-traumatic stress disorder, unspecified: Secondary | ICD-10-CM | POA: Diagnosis not present

## 2022-05-22 DIAGNOSIS — F411 Generalized anxiety disorder: Secondary | ICD-10-CM | POA: Diagnosis not present

## 2022-05-29 ENCOUNTER — Other Ambulatory Visit: Payer: Self-pay | Admitting: Cardiology

## 2022-05-29 ENCOUNTER — Other Ambulatory Visit: Payer: Self-pay | Admitting: Medical

## 2022-05-29 DIAGNOSIS — F4310 Post-traumatic stress disorder, unspecified: Secondary | ICD-10-CM | POA: Diagnosis not present

## 2022-05-29 DIAGNOSIS — F321 Major depressive disorder, single episode, moderate: Secondary | ICD-10-CM | POA: Diagnosis not present

## 2022-05-29 DIAGNOSIS — F411 Generalized anxiety disorder: Secondary | ICD-10-CM | POA: Diagnosis not present

## 2022-06-01 ENCOUNTER — Other Ambulatory Visit (HOSPITAL_BASED_OUTPATIENT_CLINIC_OR_DEPARTMENT_OTHER): Payer: Self-pay

## 2022-06-01 MED ORDER — METOPROLOL TARTRATE 100 MG PO TABS
100.0000 mg | ORAL_TABLET | ORAL | 0 refills | Status: DC
  Filled 2022-06-01: qty 1, 1d supply, fill #0

## 2022-06-01 MED ORDER — DICLOFENAC SODIUM 75 MG PO TBEC
75.0000 mg | DELAYED_RELEASE_TABLET | Freq: Two times a day (BID) | ORAL | 0 refills | Status: DC
  Filled 2022-06-01: qty 20, 10d supply, fill #0

## 2022-06-01 MED ORDER — SILDENAFIL CITRATE 50 MG PO TABS
ORAL_TABLET | ORAL | 0 refills | Status: DC
  Filled 2022-06-01: qty 10, 10d supply, fill #0

## 2022-06-03 ENCOUNTER — Ambulatory Visit (HOSPITAL_COMMUNITY): Payer: 59

## 2022-06-05 DIAGNOSIS — F321 Major depressive disorder, single episode, moderate: Secondary | ICD-10-CM | POA: Diagnosis not present

## 2022-06-05 DIAGNOSIS — F411 Generalized anxiety disorder: Secondary | ICD-10-CM | POA: Diagnosis not present

## 2022-06-05 DIAGNOSIS — F4310 Post-traumatic stress disorder, unspecified: Secondary | ICD-10-CM | POA: Diagnosis not present

## 2022-06-10 ENCOUNTER — Other Ambulatory Visit: Payer: Self-pay | Admitting: Medical

## 2022-06-11 ENCOUNTER — Other Ambulatory Visit (HOSPITAL_BASED_OUTPATIENT_CLINIC_OR_DEPARTMENT_OTHER): Payer: Self-pay

## 2022-06-11 MED ORDER — SILDENAFIL CITRATE 50 MG PO TABS
50.0000 mg | ORAL_TABLET | Freq: Every day | ORAL | 0 refills | Status: DC | PRN
  Filled 2022-06-11: qty 10, 10d supply, fill #0

## 2022-06-12 DIAGNOSIS — Z791 Long term (current) use of non-steroidal anti-inflammatories (NSAID): Secondary | ICD-10-CM | POA: Diagnosis not present

## 2022-06-12 DIAGNOSIS — F321 Major depressive disorder, single episode, moderate: Secondary | ICD-10-CM | POA: Diagnosis not present

## 2022-06-12 DIAGNOSIS — I6782 Cerebral ischemia: Secondary | ICD-10-CM | POA: Diagnosis not present

## 2022-06-12 DIAGNOSIS — F411 Generalized anxiety disorder: Secondary | ICD-10-CM | POA: Diagnosis not present

## 2022-06-12 DIAGNOSIS — Z79899 Other long term (current) drug therapy: Secondary | ICD-10-CM | POA: Diagnosis not present

## 2022-06-12 DIAGNOSIS — I639 Cerebral infarction, unspecified: Secondary | ICD-10-CM | POA: Diagnosis not present

## 2022-06-12 DIAGNOSIS — F332 Major depressive disorder, recurrent severe without psychotic features: Secondary | ICD-10-CM | POA: Diagnosis not present

## 2022-06-12 DIAGNOSIS — I10 Essential (primary) hypertension: Secondary | ICD-10-CM | POA: Diagnosis not present

## 2022-06-12 DIAGNOSIS — F4310 Post-traumatic stress disorder, unspecified: Secondary | ICD-10-CM | POA: Diagnosis not present

## 2022-06-13 DIAGNOSIS — F411 Generalized anxiety disorder: Secondary | ICD-10-CM | POA: Diagnosis not present

## 2022-06-13 DIAGNOSIS — I639 Cerebral infarction, unspecified: Secondary | ICD-10-CM | POA: Diagnosis not present

## 2022-06-13 DIAGNOSIS — Z791 Long term (current) use of non-steroidal anti-inflammatories (NSAID): Secondary | ICD-10-CM | POA: Diagnosis not present

## 2022-06-13 DIAGNOSIS — I10 Essential (primary) hypertension: Secondary | ICD-10-CM | POA: Diagnosis not present

## 2022-06-13 DIAGNOSIS — Z79899 Other long term (current) drug therapy: Secondary | ICD-10-CM | POA: Diagnosis not present

## 2022-06-13 DIAGNOSIS — F332 Major depressive disorder, recurrent severe without psychotic features: Secondary | ICD-10-CM | POA: Diagnosis not present

## 2022-06-15 ENCOUNTER — Other Ambulatory Visit (HOSPITAL_BASED_OUTPATIENT_CLINIC_OR_DEPARTMENT_OTHER): Payer: Self-pay

## 2022-06-15 DIAGNOSIS — F4310 Post-traumatic stress disorder, unspecified: Secondary | ICD-10-CM | POA: Diagnosis not present

## 2022-06-15 DIAGNOSIS — F321 Major depressive disorder, single episode, moderate: Secondary | ICD-10-CM | POA: Diagnosis not present

## 2022-06-15 DIAGNOSIS — F411 Generalized anxiety disorder: Secondary | ICD-10-CM | POA: Diagnosis not present

## 2022-06-15 MED ORDER — ASPIRIN 81 MG PO CHEW
81.0000 mg | CHEWABLE_TABLET | Freq: Every day | ORAL | 0 refills | Status: AC
  Filled 2022-06-15: qty 36, 36d supply, fill #0

## 2022-06-15 MED ORDER — CLOPIDOGREL BISULFATE 75 MG PO TABS
75.0000 mg | ORAL_TABLET | Freq: Every day | ORAL | 0 refills | Status: AC
  Filled 2022-06-15: qty 30, 30d supply, fill #0

## 2022-06-15 MED ORDER — ATORVASTATIN CALCIUM 80 MG PO TABS
80.0000 mg | ORAL_TABLET | Freq: Every day | ORAL | 0 refills | Status: AC
  Filled 2022-06-15: qty 30, 30d supply, fill #0

## 2022-06-19 DIAGNOSIS — F411 Generalized anxiety disorder: Secondary | ICD-10-CM | POA: Diagnosis not present

## 2022-06-19 DIAGNOSIS — F321 Major depressive disorder, single episode, moderate: Secondary | ICD-10-CM | POA: Diagnosis not present

## 2022-06-19 DIAGNOSIS — F4310 Post-traumatic stress disorder, unspecified: Secondary | ICD-10-CM | POA: Diagnosis not present

## 2022-06-23 ENCOUNTER — Ambulatory Visit (INDEPENDENT_AMBULATORY_CARE_PROVIDER_SITE_OTHER): Payer: 59 | Admitting: Behavioral Health

## 2022-06-23 ENCOUNTER — Other Ambulatory Visit (HOSPITAL_BASED_OUTPATIENT_CLINIC_OR_DEPARTMENT_OTHER): Payer: Self-pay

## 2022-06-23 ENCOUNTER — Encounter: Payer: Self-pay | Admitting: Behavioral Health

## 2022-06-23 DIAGNOSIS — Z634 Disappearance and death of family member: Secondary | ICD-10-CM

## 2022-06-23 DIAGNOSIS — F4310 Post-traumatic stress disorder, unspecified: Secondary | ICD-10-CM

## 2022-06-23 DIAGNOSIS — F332 Major depressive disorder, recurrent severe without psychotic features: Secondary | ICD-10-CM

## 2022-06-23 DIAGNOSIS — F321 Major depressive disorder, single episode, moderate: Secondary | ICD-10-CM | POA: Diagnosis not present

## 2022-06-23 DIAGNOSIS — F331 Major depressive disorder, recurrent, moderate: Secondary | ICD-10-CM

## 2022-06-23 DIAGNOSIS — R45851 Suicidal ideations: Secondary | ICD-10-CM | POA: Diagnosis not present

## 2022-06-23 DIAGNOSIS — Z639 Problem related to primary support group, unspecified: Secondary | ICD-10-CM

## 2022-06-23 DIAGNOSIS — F4321 Adjustment disorder with depressed mood: Secondary | ICD-10-CM | POA: Diagnosis not present

## 2022-06-23 DIAGNOSIS — F411 Generalized anxiety disorder: Secondary | ICD-10-CM | POA: Diagnosis not present

## 2022-06-23 MED ORDER — CLONAZEPAM 0.5 MG PO TABS
0.5000 mg | ORAL_TABLET | Freq: Every day | ORAL | 3 refills | Status: DC
  Filled 2022-06-23 – 2022-06-30 (×2): qty 30, 30d supply, fill #0
  Filled 2022-08-26: qty 30, 30d supply, fill #1
  Filled 2022-10-14: qty 30, 30d supply, fill #2
  Filled 2022-11-07: qty 30, 30d supply, fill #3

## 2022-06-23 MED ORDER — BUPROPION HCL ER (XL) 300 MG PO TB24
300.0000 mg | ORAL_TABLET | Freq: Every day | ORAL | 3 refills | Status: AC
  Filled 2022-06-23: qty 30, 30d supply, fill #0
  Filled 2022-08-26: qty 30, 30d supply, fill #1
  Filled 2022-10-14: qty 30, 30d supply, fill #2
  Filled 2022-11-07: qty 30, 30d supply, fill #3

## 2022-06-23 MED ORDER — ESCITALOPRAM OXALATE 10 MG PO TABS
10.0000 mg | ORAL_TABLET | Freq: Every day | ORAL | 1 refills | Status: AC
  Filled 2022-06-23: qty 30, 30d supply, fill #0

## 2022-06-23 MED ORDER — LITHIUM CARBONATE 300 MG PO TABS
300.0000 mg | ORAL_TABLET | Freq: Three times a day (TID) | ORAL | 1 refills | Status: DC
  Filled 2022-06-23: qty 30, 10d supply, fill #0

## 2022-06-23 NOTE — Progress Notes (Signed)
[facility] Check  Patient ID: Joshua Hansen,  MRN: [id number]  PCP: Elise Benne  Date of Evaluation: 06/23/2022 Time spent:40 minutes  Chief Complaint:  Chief Complaint   Depression; Anxiety; Patient Education; Medication Refill; Medication Problem; Family Problem; Suicidal Ideation     HISTORY/CURRENT STATUS: HPI 41 year old male presents via video visit for follow up and medication management. No interpreter with him this visit. English has improved. Speaking in simple sentences and slowly. Collateral information is reliable and also is consistent with medical record notes.  He says that he tried to kill himself on 11/9 by carbon monoxide poisoning.  Says that he ran a tube from his exhaust on his car to inside his window of the vehicle.  Says that he became sleepy and remembers waking up a couple times and then going back to sleep but later found himself on the ground in the parking lot of his apartment complex.  Says that he does not understand how he survived it but he went back into his apartment and went to sleep.  Says that when he woke up he did not feel well with severe headaches and nausea.  The next day he attempted to go to work and was unable to complete the day, so he had a friend take him to the ER in [facility] with [place].  Says that he did not tell medical staff what had happened to the previous day but they performed a workup for cerebrovascular accident.  Says that he spent 2 days in the hospital before being released to home.  He is currently on a anticoagulant for 30 days along with aspirin.  He has shared with his therapist the suicide attempt.  Says that he and his wife recently split up again and that he has relapsed with the grief concerning his deceased daughter.  He says that he is not suicidal today but frequently has passive suicidal ideation.  He says that he has been utilizing the suicide hotline and that they  have been checking on him regularly.  He verbally contracts for safety today and agrees to utilize his emergency resources if needed. He refuses to go to ER or [facility] today for further evaluation and says that his feels safe. Says that he does not want to kill himself because of the love he has for his remaining daughter.  He is currently working 40+ hours per week.  He says his anxiety today is 8/10 and depression 8/10.  Last visit he was reporting very low level anxiety and depression.  He is requesting to try new medication for anxiety and depression because he believes the Wellbutrin is no longer working very well.  He is sleeping 6-7 hours per night. He says that he realizes his mistakes and does not want to die.  Agrees to f/u in 4 weeks. Denies mania, no psychosis. Passive SI with no current plan. No HI. Agrees to continue working closely with therapist.   Prior psychiatric medication trial: Effexor Hydroxyzine  Individual Medical History/ Review of Systems: Changes? :No   Allergies: Patient has no known allergies.  Current Medications:  Current Outpatient Medications:    escitalopram (LEXAPRO) 10 MG tablet, Take 1 tablet (10 mg total) by mouth daily., Disp: 30 tablet, Rfl: 1   lithium 300 MG tablet, Take 1 tablet (300 mg total) by mouth 3 (three) times daily., Disp: 30 tablet, Rfl: 1   amLODipine (NORVASC) 5 MG tablet, Take 1 tablet (5  mg total) by mouth daily., Disp: 90 tablet, Rfl: 0   aspirin 81 MG chewable tablet, Chew one tablet (81 mg dose) by mouth daily for 28 days., Disp: 36 tablet, Rfl: 0   atorvastatin (LIPITOR) 80 MG tablet, Take one tablet (80 mg dose) by mouth at bedtime., Disp: 30 tablet, Rfl: 0   buPROPion (WELLBUTRIN XL) 300 MG 24 hr tablet, Take 1 tablet (300 mg total) by mouth daily., Disp: 30 tablet, Rfl: 3   clonazePAM (KLONOPIN) 0.5 MG tablet, Take 1 tablet by mouth daily., Disp: 30 tablet, Rfl: 3   clopidogrel (PLAVIX) 75 MG tablet, Take one tablet (75 mg dose) by  mouth daily for 30 days., Disp: 30 tablet, Rfl: 0   cyclobenzaprine (FLEXERIL) 10 MG tablet, Take 1 tablet by mouth at bedtime as needed for neck pain or tension headache., Disp: 10 tablet, Rfl: 0   lisinopril (ZESTRIL) 40 MG tablet, TAKE 1 TABLET (40 MG TOTAL) BY MOUTH DAILY., Disp: 90 tablet, Rfl: 3   metoprolol tartrate (LOPRESSOR) 100 MG tablet, Take 1 tablet (100 mg total) by mouth as directed. 2 hours prior to CT scan., Disp: 1 tablet, Rfl: 0   sildenafil (VIAGRA) 50 MG tablet, Take 1 tablet (50 mg total) by mouth daily as needed for erectile dysfunction., Disp: 10 tablet, Rfl: 0 Medication Side Effects: none  Family Medical/ Social History: Changes? No  MENTAL HEALTH EXAM:  There were no vitals taken for this visit.There is no height or weight on file to calculate BMI.  General Appearance: Casual, Neat, and Well Groomed  Eye Contact:  Good  Speech:  Clear and Coherent, Primary Language is Spanish no need for interpreter this visit.  Volume:  Normal  Mood:  Anxious, Depressed, and Dysphoric  Affect:  Congruent, Depressed, Flat, and Anxious  Thought Process:  Coherent  Orientation:  Full (Time, Place, and Person)  Thought Content: Logical   Suicidal Thoughts:  No  Homicidal Thoughts:  No  Memory:  WNL  Judgement:  Good  Insight:  Good  Psychomotor Activity:  Normal  Concentration:  Concentration: Good  Recall:  Good  Fund of Knowledge: Good  Language: Good  Assets:  Desire for Improvement  ADL's:  Intact  Cognition: WNL  Prognosis:  Good    DIAGNOSES:    ICD-10-CM   1. Generalized anxiety disorder  F41.1 escitalopram (LEXAPRO) 10 MG tablet    clonazePAM (KLONOPIN) 0.5 MG tablet    buPROPion (WELLBUTRIN XL) 300 MG 24 hr tablet    2. Grief at loss of child  F43.21 escitalopram (LEXAPRO) 10 MG tablet   Z63.4 buPROPion (WELLBUTRIN XL) 300 MG 24 hr tablet    3. PTSD (post-traumatic stress disorder)  F43.10 escitalopram (LEXAPRO) 10 MG tablet    4. Family problems   Z63.9     5. Severe episode of recurrent major depressive disorder, without psychotic features ([place])  F33.2 escitalopram (LEXAPRO) 10 MG tablet    6. Suicidal ideation  R45.851 lithium 300 MG tablet    7. Major depressive disorder, recurrent episode, moderate ([facility])  F33.1 buPROPion (WELLBUTRIN XL) 300 MG 24 hr tablet      Receiving Psychotherapy: yes   RECOMMENDATIONS:   Greater than 50% of 40  face to face visit with patient was spent on counseling and coordination of care.  We discussed his recent decline in his report of severe depression and anxiety.  We talked about his recent separation from his wife.  She told him that she did not want  to be with him anymore. He continues to have grief over his deceased daughter.  We discussed his report of recent suicide attempt approximately 11/9.  He did not go to the ER or call 911. He became ill after report of carbon monoxide poisoning and went to the ER the next day.   This may have been the cause of cerebrovascular event that was evaluated at the ER.  Patient was not truthful with medical staff about the previous suicide attempt involving carbon monoxide gas.  I spoke with him in detail about resources that were available to him to include 911, going to the ER, calling a friend, suicide hotline, and continued care with his therapist.  We discussed medications that may be helpful and added to his current medication regimen.  He was agreeable and stated that he would follow instructions and compliance with taking his medication. He verbally contracts for safety and agrees to utilize resources if SI becomes severe.   SBQ-R screen=13.1 positive  Today we agreed to: Continue Wellbutrin to 300 mg daily Continue the remaining Klonopin he has left only for severe anxiety. He is not using everyday.  To start low dose Lithium 300 mg daily at bedtime for SI To start Lexapro 10 mg daily for increased depression and anxiety Will report worsening symptoms  promptly To follow up in 4 weeks to reassess Provided emergency contact information Discussed potential benefits, risk, and side effects of benzodiazepines to include potential risk of tolerance and dependence, as well as possible drowsiness.  Advised patient not to drive if experiencing drowsiness and to take lowest possible effective dose to minimize risk of dependence and tolerance.  Reviewed PDMP    [name], NP

## 2022-06-24 ENCOUNTER — Other Ambulatory Visit (HOSPITAL_BASED_OUTPATIENT_CLINIC_OR_DEPARTMENT_OTHER): Payer: Self-pay

## 2022-06-24 DIAGNOSIS — F32A Depression, unspecified: Secondary | ICD-10-CM | POA: Diagnosis not present

## 2022-06-24 DIAGNOSIS — R45851 Suicidal ideations: Secondary | ICD-10-CM | POA: Diagnosis not present

## 2022-06-24 DIAGNOSIS — Z7982 Long term (current) use of aspirin: Secondary | ICD-10-CM | POA: Diagnosis not present

## 2022-06-24 DIAGNOSIS — Z7901 Long term (current) use of anticoagulants: Secondary | ICD-10-CM | POA: Diagnosis not present

## 2022-06-24 DIAGNOSIS — Z79899 Other long term (current) drug therapy: Secondary | ICD-10-CM | POA: Diagnosis not present

## 2022-06-24 DIAGNOSIS — F332 Major depressive disorder, recurrent severe without psychotic features: Secondary | ICD-10-CM | POA: Diagnosis not present

## 2022-06-24 DIAGNOSIS — I10 Essential (primary) hypertension: Secondary | ICD-10-CM | POA: Diagnosis not present

## 2022-06-24 DIAGNOSIS — F419 Anxiety disorder, unspecified: Secondary | ICD-10-CM | POA: Diagnosis not present

## 2022-06-25 DIAGNOSIS — Z79899 Other long term (current) drug therapy: Secondary | ICD-10-CM | POA: Diagnosis not present

## 2022-06-25 DIAGNOSIS — Z7901 Long term (current) use of anticoagulants: Secondary | ICD-10-CM | POA: Diagnosis not present

## 2022-06-25 DIAGNOSIS — R45851 Suicidal ideations: Secondary | ICD-10-CM | POA: Diagnosis not present

## 2022-06-25 DIAGNOSIS — Z7982 Long term (current) use of aspirin: Secondary | ICD-10-CM | POA: Diagnosis not present

## 2022-06-25 DIAGNOSIS — F419 Anxiety disorder, unspecified: Secondary | ICD-10-CM | POA: Diagnosis not present

## 2022-06-25 DIAGNOSIS — I10 Essential (primary) hypertension: Secondary | ICD-10-CM | POA: Diagnosis not present

## 2022-06-25 DIAGNOSIS — F332 Major depressive disorder, recurrent severe without psychotic features: Secondary | ICD-10-CM | POA: Diagnosis not present

## 2022-06-26 ENCOUNTER — Other Ambulatory Visit (HOSPITAL_BASED_OUTPATIENT_CLINIC_OR_DEPARTMENT_OTHER): Payer: Self-pay

## 2022-06-29 ENCOUNTER — Other Ambulatory Visit (HOSPITAL_BASED_OUTPATIENT_CLINIC_OR_DEPARTMENT_OTHER): Payer: Self-pay

## 2022-06-29 DIAGNOSIS — F411 Generalized anxiety disorder: Secondary | ICD-10-CM | POA: Diagnosis not present

## 2022-06-29 DIAGNOSIS — F321 Major depressive disorder, single episode, moderate: Secondary | ICD-10-CM | POA: Diagnosis not present

## 2022-06-29 DIAGNOSIS — F4310 Post-traumatic stress disorder, unspecified: Secondary | ICD-10-CM | POA: Diagnosis not present

## 2022-06-29 MED ORDER — ESCITALOPRAM OXALATE 10 MG PO TABS
10.0000 mg | ORAL_TABLET | Freq: Every day | ORAL | 0 refills | Status: AC
  Filled 2022-06-29: qty 30, 30d supply, fill #0

## 2022-06-29 MED ORDER — NICOTINE POLACRILEX 2 MG MT GUM
2.0000 mg | CHEWING_GUM | OROMUCOSAL | 0 refills | Status: AC | PRN
  Filled 2022-06-29: qty 100, 10d supply, fill #0
  Filled 2022-06-29: qty 110, 10d supply, fill #0
  Filled 2022-06-29: qty 100, 10d supply, fill #1
  Filled 2022-06-29 (×2): qty 250, 25d supply, fill #0

## 2022-06-30 ENCOUNTER — Other Ambulatory Visit (HOSPITAL_BASED_OUTPATIENT_CLINIC_OR_DEPARTMENT_OTHER): Payer: Self-pay

## 2022-07-03 ENCOUNTER — Encounter: Payer: Self-pay | Admitting: Behavioral Health

## 2022-07-03 ENCOUNTER — Other Ambulatory Visit (HOSPITAL_BASED_OUTPATIENT_CLINIC_OR_DEPARTMENT_OTHER): Payer: Self-pay

## 2022-07-03 ENCOUNTER — Ambulatory Visit (INDEPENDENT_AMBULATORY_CARE_PROVIDER_SITE_OTHER): Payer: 59 | Admitting: Behavioral Health

## 2022-07-03 DIAGNOSIS — R45851 Suicidal ideations: Secondary | ICD-10-CM | POA: Diagnosis not present

## 2022-07-03 DIAGNOSIS — Z634 Disappearance and death of family member: Secondary | ICD-10-CM | POA: Diagnosis not present

## 2022-07-03 DIAGNOSIS — F331 Major depressive disorder, recurrent, moderate: Secondary | ICD-10-CM

## 2022-07-03 DIAGNOSIS — F4310 Post-traumatic stress disorder, unspecified: Secondary | ICD-10-CM

## 2022-07-03 DIAGNOSIS — F4321 Adjustment disorder with depressed mood: Secondary | ICD-10-CM | POA: Diagnosis not present

## 2022-07-03 DIAGNOSIS — Z639 Problem related to primary support group, unspecified: Secondary | ICD-10-CM

## 2022-07-03 DIAGNOSIS — F321 Major depressive disorder, single episode, moderate: Secondary | ICD-10-CM | POA: Diagnosis not present

## 2022-07-03 DIAGNOSIS — F411 Generalized anxiety disorder: Secondary | ICD-10-CM | POA: Diagnosis not present

## 2022-07-03 NOTE — Progress Notes (Signed)
[facility]  Patient ID: Joshua Hansen,  MRN: [id number]  PCP: Elise Benne  Date of Evaluation: 07/03/2022 Time spent:20 minutes  Chief Complaint:  Chief Complaint   Depression; Anxiety; Follow-up; Patient Education; Suicidal Ideation     HISTORY/CURRENT STATUS: HPI  41 year old male presents via video visit for follow up and medication management. No interpreter with him this visit.  He is smiling and very well groomed. Says he is feeling positive and no SI in the last few days. He is gla that he presented to the ER at [facility] in [place] on 11/23 for SI with plan. He understands the resources that are available to him.  He says his anxiety today is 2/10 and depression 2/10.  He feels like the medication is starting to work. He just started his lithium on 11/27 because they did not provide at the hospital.  He is sleeping 6-7 hours per night. He says that he realizes his mistakes and does not want to die.  Agrees to f/u in 4 weeks. Denies mania, no psychosis. No current SI, No HI. Agrees to continue working closely with therapist. [name] says that he feels safe and this time and verbally contracted for safety. He is cooperative and has hx of asking for help when needed.    Prior psychiatric medication trial: Effexor Hydroxyzine  Individual Medical History/ Review of Systems: Changes? :No   Allergies: Patient has no known allergies.  Current Medications:  Current Outpatient Medications:    amLODipine (NORVASC) 5 MG tablet, Take 1 tablet (5 mg total) by mouth daily., Disp: 90 tablet, Rfl: 0   aspirin 81 MG chewable tablet, Chew one tablet (81 mg dose) by mouth daily for 28 days., Disp: 36 tablet, Rfl: 0   atorvastatin (LIPITOR) 80 MG tablet, Take one tablet (80 mg dose) by mouth at bedtime., Disp: 30 tablet, Rfl: 0   buPROPion (WELLBUTRIN XL) 300 MG 24 hr tablet, Take 1 tablet (300 mg total) by mouth daily., Disp: 30 tablet, Rfl: 3   clonazePAM  (KLONOPIN) 0.5 MG tablet, Take 1 tablet by mouth daily., Disp: 30 tablet, Rfl: 3   clopidogrel (PLAVIX) 75 MG tablet, Take one tablet (75 mg dose) by mouth daily for 30 days., Disp: 30 tablet, Rfl: 0   cyclobenzaprine (FLEXERIL) 10 MG tablet, Take 1 tablet by mouth at bedtime as needed for neck pain or tension headache., Disp: 10 tablet, Rfl: 0   escitalopram (LEXAPRO) 10 MG tablet, Take 1 tablet (10 mg total) by mouth daily., Disp: 30 tablet, Rfl: 1   escitalopram (LEXAPRO) 10 MG tablet, Take 1 tablet (10 mg total) by mouth at bedtime., Disp: 30 tablet, Rfl: 0   lisinopril (ZESTRIL) 40 MG tablet, TAKE 1 TABLET (40 MG TOTAL) BY MOUTH DAILY., Disp: 90 tablet, Rfl: 3   lithium 300 MG tablet, Take 1 tablet (300 mg total) by mouth 3 (three) times daily., Disp: 30 tablet, Rfl: 1   metoprolol tartrate (LOPRESSOR) 100 MG tablet, Take 1 tablet (100 mg total) by mouth as directed. 2 hours prior to CT scan., Disp: 1 tablet, Rfl: 0   nicotine polacrilex (NICORETTE) 2 MG gum, Take 1 (2 mg total) by mouth as needed for smoking cessation, Disp: 270 each, Rfl: 0   sildenafil (VIAGRA) 50 MG tablet, Take 1 tablet (50 mg total) by mouth daily as needed for erectile dysfunction., Disp: 10 tablet, Rfl: 0 Medication Side Effects: none  Family Medical/ Social History: Changes? No  MENTAL HEALTH  EXAM:  There were no vitals taken for this visit.There is no height or weight on file to calculate BMI.  General Appearance: Casual, Neat, and Well Groomed  Eye Contact:  Good  Speech:  Clear and Coherent  Volume:  Normal  Mood:  NA  Affect:  Appropriate  Thought Process:  Coherent  Orientation:  Full (Time, Place, and Person)  Thought Content: Logical   Suicidal Thoughts:  No  Homicidal Thoughts:  No  Memory:  WNL  Judgement:  Good  Insight:  Good  Psychomotor Activity:  Normal  Concentration:  Concentration: Good  Recall:  Good  Fund of Knowledge: Good  Language: Good  Assets:  Desire for Improvement  ADL's:   Intact  Cognition: WNL  Prognosis:  Good    DIAGNOSES:    ICD-10-CM   1. Generalized anxiety disorder  F41.1     2. Grief at loss of child  F43.21    Z63.4     3. PTSD (post-traumatic stress disorder)  F43.10     4. Family problems  Z63.9     5. Suicidal ideation  R45.851     6. Major depressive disorder, recurrent episode, moderate ([place])  F33.1       Receiving Psychotherapy: No    RECOMMENDATIONS:   Greater than 50% of 20  face to face visit with patient was spent on counseling and coordination of care.  He was smiling today and says that he feels much better since his ER visit and evaluation in [facility] with [facility]. He started the lithium on 11/27 and says that he has had no suicidal ideation the last few days. I reinforced with him the  resources that were available to him to include 911, going to the ER, calling a friend, suicide hotline, and continued care with his therapist.  He was agreeable and stated that he would follow instructions and compliance with taking his medication. He verbally contracts for safety and agrees to utilize resources if SI becomes severe.       Today we agreed to: Continue Wellbutrin to 300 mg daily Continue the remaining Klonopin he has left only for severe anxiety. He is not using everyday.  Continue low dose Lithium 300 mg daily at bedtime for SI Continue  Lexapro 10 mg daily for increased depression and anxiety Will report worsening symptoms promptly To follow up in 4 weeks to reassess To request lithium labs next visit Provided emergency contact information Discussed potential benefits, risk, and side effects of benzodiazepines to include potential risk of tolerance and dependence, as well as possible drowsiness.  Advised patient not to drive if experiencing drowsiness and to take lowest possible effective dose to minimize risk of dependence and tolerance.  Reviewed PDMP           [name], NP

## 2022-07-08 DIAGNOSIS — F411 Generalized anxiety disorder: Secondary | ICD-10-CM | POA: Diagnosis not present

## 2022-07-08 DIAGNOSIS — F4310 Post-traumatic stress disorder, unspecified: Secondary | ICD-10-CM | POA: Diagnosis not present

## 2022-07-08 DIAGNOSIS — F321 Major depressive disorder, single episode, moderate: Secondary | ICD-10-CM | POA: Diagnosis not present

## 2022-07-10 DIAGNOSIS — F321 Major depressive disorder, single episode, moderate: Secondary | ICD-10-CM | POA: Diagnosis not present

## 2022-07-10 DIAGNOSIS — F411 Generalized anxiety disorder: Secondary | ICD-10-CM | POA: Diagnosis not present

## 2022-07-10 DIAGNOSIS — F4310 Post-traumatic stress disorder, unspecified: Secondary | ICD-10-CM | POA: Diagnosis not present

## 2022-07-14 DIAGNOSIS — F4321 Adjustment disorder with depressed mood: Secondary | ICD-10-CM | POA: Diagnosis not present

## 2022-07-14 DIAGNOSIS — F332 Major depressive disorder, recurrent severe without psychotic features: Secondary | ICD-10-CM | POA: Diagnosis not present

## 2022-07-14 DIAGNOSIS — F419 Anxiety disorder, unspecified: Secondary | ICD-10-CM | POA: Diagnosis not present

## 2022-07-14 DIAGNOSIS — R29818 Other symptoms and signs involving the nervous system: Secondary | ICD-10-CM | POA: Diagnosis not present

## 2022-07-14 DIAGNOSIS — Z634 Disappearance and death of family member: Secondary | ICD-10-CM | POA: Diagnosis not present

## 2022-07-14 DIAGNOSIS — I10 Essential (primary) hypertension: Secondary | ICD-10-CM | POA: Diagnosis not present

## 2022-07-17 ENCOUNTER — Ambulatory Visit: Payer: 59 | Admitting: Medical

## 2022-07-17 DIAGNOSIS — F4310 Post-traumatic stress disorder, unspecified: Secondary | ICD-10-CM | POA: Diagnosis not present

## 2022-07-17 DIAGNOSIS — F321 Major depressive disorder, single episode, moderate: Secondary | ICD-10-CM | POA: Diagnosis not present

## 2022-07-17 DIAGNOSIS — F411 Generalized anxiety disorder: Secondary | ICD-10-CM | POA: Diagnosis not present

## 2022-07-18 DIAGNOSIS — E785 Hyperlipidemia, unspecified: Secondary | ICD-10-CM | POA: Diagnosis not present

## 2022-07-18 DIAGNOSIS — J690 Pneumonitis due to inhalation of food and vomit: Secondary | ICD-10-CM | POA: Diagnosis not present

## 2022-07-18 DIAGNOSIS — I5022 Chronic systolic (congestive) heart failure: Secondary | ICD-10-CM | POA: Diagnosis not present

## 2022-07-18 DIAGNOSIS — G928 Other toxic encephalopathy: Secondary | ICD-10-CM | POA: Diagnosis not present

## 2022-07-18 DIAGNOSIS — Z9151 Personal history of suicidal behavior: Secondary | ICD-10-CM | POA: Diagnosis not present

## 2022-07-18 DIAGNOSIS — J9601 Acute respiratory failure with hypoxia: Secondary | ICD-10-CM | POA: Diagnosis not present

## 2022-07-18 DIAGNOSIS — F332 Major depressive disorder, recurrent severe without psychotic features: Secondary | ICD-10-CM | POA: Diagnosis not present

## 2022-07-18 DIAGNOSIS — F411 Generalized anxiety disorder: Secondary | ICD-10-CM | POA: Diagnosis not present

## 2022-07-18 DIAGNOSIS — R578 Other shock: Secondary | ICD-10-CM | POA: Diagnosis not present

## 2022-07-18 DIAGNOSIS — Z79899 Other long term (current) drug therapy: Secondary | ICD-10-CM | POA: Diagnosis not present

## 2022-07-18 DIAGNOSIS — T50912A Poisoning by multiple unspecified drugs, medicaments and biological substances, intentional self-harm, initial encounter: Secondary | ICD-10-CM | POA: Diagnosis not present

## 2022-07-18 DIAGNOSIS — J95851 Ventilator associated pneumonia: Secondary | ICD-10-CM | POA: Diagnosis not present

## 2022-07-18 DIAGNOSIS — R9431 Abnormal electrocardiogram [ECG] [EKG]: Secondary | ICD-10-CM | POA: Diagnosis not present

## 2022-07-18 DIAGNOSIS — T8111XA Postprocedural  cardiogenic shock, initial encounter: Secondary | ICD-10-CM | POA: Diagnosis not present

## 2022-07-18 DIAGNOSIS — J14 Pneumonia due to Hemophilus influenzae: Secondary | ICD-10-CM | POA: Diagnosis not present

## 2022-07-18 DIAGNOSIS — F329 Major depressive disorder, single episode, unspecified: Secondary | ICD-10-CM | POA: Diagnosis not present

## 2022-07-18 DIAGNOSIS — B963 Hemophilus influenzae [H. influenzae] as the cause of diseases classified elsewhere: Secondary | ICD-10-CM | POA: Diagnosis not present

## 2022-07-18 DIAGNOSIS — I10 Essential (primary) hypertension: Secondary | ICD-10-CM | POA: Diagnosis not present

## 2022-07-18 DIAGNOSIS — Z781 Physical restraint status: Secondary | ICD-10-CM | POA: Diagnosis not present

## 2022-07-18 DIAGNOSIS — F4310 Post-traumatic stress disorder, unspecified: Secondary | ICD-10-CM | POA: Diagnosis not present

## 2022-07-22 ENCOUNTER — Ambulatory Visit: Payer: 59 | Admitting: Behavioral Health

## 2022-07-28 DIAGNOSIS — Z91148 Patient's other noncompliance with medication regimen for other reason: Secondary | ICD-10-CM | POA: Diagnosis not present

## 2022-07-28 DIAGNOSIS — F329 Major depressive disorder, single episode, unspecified: Secondary | ICD-10-CM | POA: Diagnosis not present

## 2022-07-28 DIAGNOSIS — R569 Unspecified convulsions: Secondary | ICD-10-CM | POA: Diagnosis not present

## 2022-07-28 DIAGNOSIS — G9340 Encephalopathy, unspecified: Secondary | ICD-10-CM | POA: Diagnosis not present

## 2022-07-28 DIAGNOSIS — F4310 Post-traumatic stress disorder, unspecified: Secondary | ICD-10-CM | POA: Diagnosis not present

## 2022-07-28 DIAGNOSIS — R031 Nonspecific low blood-pressure reading: Secondary | ICD-10-CM | POA: Diagnosis not present

## 2022-07-28 DIAGNOSIS — Z634 Disappearance and death of family member: Secondary | ICD-10-CM | POA: Diagnosis not present

## 2022-07-28 DIAGNOSIS — F419 Anxiety disorder, unspecified: Secondary | ICD-10-CM | POA: Diagnosis not present

## 2022-07-28 DIAGNOSIS — J9601 Acute respiratory failure with hypoxia: Secondary | ICD-10-CM | POA: Diagnosis not present

## 2022-07-28 DIAGNOSIS — I5020 Unspecified systolic (congestive) heart failure: Secondary | ICD-10-CM | POA: Diagnosis not present

## 2022-07-28 DIAGNOSIS — T50902A Poisoning by unspecified drugs, medicaments and biological substances, intentional self-harm, initial encounter: Secondary | ICD-10-CM | POA: Diagnosis not present

## 2022-07-28 DIAGNOSIS — Z9151 Personal history of suicidal behavior: Secondary | ICD-10-CM | POA: Diagnosis not present

## 2022-07-28 DIAGNOSIS — F411 Generalized anxiety disorder: Secondary | ICD-10-CM | POA: Diagnosis not present

## 2022-07-28 DIAGNOSIS — T464X5A Adverse effect of angiotensin-converting-enzyme inhibitors, initial encounter: Secondary | ICD-10-CM | POA: Diagnosis not present

## 2022-07-28 DIAGNOSIS — F332 Major depressive disorder, recurrent severe without psychotic features: Secondary | ICD-10-CM | POA: Diagnosis not present

## 2022-07-28 DIAGNOSIS — J690 Pneumonitis due to inhalation of food and vomit: Secondary | ICD-10-CM | POA: Diagnosis not present

## 2022-07-28 DIAGNOSIS — I10 Essential (primary) hypertension: Secondary | ICD-10-CM | POA: Diagnosis not present

## 2022-07-29 DIAGNOSIS — F332 Major depressive disorder, recurrent severe without psychotic features: Secondary | ICD-10-CM | POA: Diagnosis not present

## 2022-07-29 DIAGNOSIS — Z9151 Personal history of suicidal behavior: Secondary | ICD-10-CM | POA: Diagnosis not present

## 2022-07-30 DIAGNOSIS — F332 Major depressive disorder, recurrent severe without psychotic features: Secondary | ICD-10-CM | POA: Diagnosis not present

## 2022-07-30 DIAGNOSIS — Z9151 Personal history of suicidal behavior: Secondary | ICD-10-CM | POA: Diagnosis not present

## 2022-07-31 DIAGNOSIS — Z9151 Personal history of suicidal behavior: Secondary | ICD-10-CM | POA: Diagnosis not present

## 2022-07-31 DIAGNOSIS — F332 Major depressive disorder, recurrent severe without psychotic features: Secondary | ICD-10-CM | POA: Diagnosis not present

## 2022-08-01 DIAGNOSIS — F332 Major depressive disorder, recurrent severe without psychotic features: Secondary | ICD-10-CM | POA: Diagnosis not present

## 2022-08-02 DIAGNOSIS — F332 Major depressive disorder, recurrent severe without psychotic features: Secondary | ICD-10-CM | POA: Diagnosis not present

## 2022-08-02 DIAGNOSIS — Z9151 Personal history of suicidal behavior: Secondary | ICD-10-CM | POA: Diagnosis not present

## 2022-08-07 ENCOUNTER — Telehealth (INDEPENDENT_AMBULATORY_CARE_PROVIDER_SITE_OTHER): Payer: Commercial Managed Care - PPO | Admitting: Behavioral Health

## 2022-08-07 ENCOUNTER — Encounter: Payer: Self-pay | Admitting: Behavioral Health

## 2022-08-07 ENCOUNTER — Other Ambulatory Visit (HOSPITAL_BASED_OUTPATIENT_CLINIC_OR_DEPARTMENT_OTHER): Payer: Self-pay

## 2022-08-07 DIAGNOSIS — F4321 Adjustment disorder with depressed mood: Secondary | ICD-10-CM

## 2022-08-07 DIAGNOSIS — Z634 Disappearance and death of family member: Secondary | ICD-10-CM

## 2022-08-07 DIAGNOSIS — R45851 Suicidal ideations: Secondary | ICD-10-CM | POA: Diagnosis not present

## 2022-08-07 DIAGNOSIS — F4310 Post-traumatic stress disorder, unspecified: Secondary | ICD-10-CM

## 2022-08-07 DIAGNOSIS — Z639 Problem related to primary support group, unspecified: Secondary | ICD-10-CM

## 2022-08-07 DIAGNOSIS — F411 Generalized anxiety disorder: Secondary | ICD-10-CM

## 2022-08-07 DIAGNOSIS — F331 Major depressive disorder, recurrent, moderate: Secondary | ICD-10-CM

## 2022-08-07 MED ORDER — ESCITALOPRAM OXALATE 20 MG PO TABS
20.0000 mg | ORAL_TABLET | Freq: Every day | ORAL | 2 refills | Status: AC
  Filled 2022-08-07: qty 30, 30d supply, fill #0

## 2022-08-07 MED ORDER — LITHIUM CARBONATE 600 MG PO CAPS
600.0000 mg | ORAL_CAPSULE | Freq: Every evening | ORAL | 2 refills | Status: AC
  Filled 2022-08-07: qty 30, 30d supply, fill #0

## 2022-08-07 MED ORDER — MIRTAZAPINE 15 MG PO TABS
15.0000 mg | ORAL_TABLET | Freq: Every day | ORAL | 2 refills | Status: AC
  Filled 2022-08-07: qty 30, 30d supply, fill #0

## 2022-08-07 NOTE — Progress Notes (Deleted)
[facility]  Patient ID: Joshua Hansen,  MRN: [id number]  PCP: Elise Benne  Date of Evaluation: 08/07/2022 Time spent:30 minutes  Chief Complaint:  Chief Complaint   Follow-up; Anxiety; Depression; Trauma; Patient Education; Medication Problem; Suicidal Ideation     HISTORY/CURRENT STATUS: HPI  42 year old male presents via video visit for follow up and medication management. No interpreter with him this visit.  He is flat and says he has not been doing well. He had another inpatient hospitalization on Dec. 26th. Says he is not suicidal today and feels safe. Feels like he has made some improvement. Says he is sad because his estranged wife will not let him see his daughter. She has stopped contact with him .  Says he is feeling positive and no SI in the last few days.  He says he may go to [place] to visit his mother because he feels it may be good for him. He understands the resources that are available.  He says his anxiety today is 2/10 and depression 2/10.    He is sleeping 6-7 hours per night. He says that he realizes his mistakes and does not want to die.  Agrees to f/u in 4 weeks. Denies mania, no psychosis. No current SI, No HI. Agrees to continue working closely with therapist. Lucile says that he feels safe and this time and verbally contracted for safety. He is cooperative and has hx of asking for help when needed.    Prior psychiatric medication trial: Effexor Hydroxyzine   Individual Medical History/ Review of Systems: Changes? :No   Allergies: Patient has no known allergies.  Current Medications:  Current Outpatient Medications:    amLODipine (NORVASC) 5 MG tablet, Take 1 tablet (5 mg total) by mouth daily., Disp: 90 tablet, Rfl: 0   aspirin 81 MG chewable tablet, Chew one tablet (81 mg dose) by mouth daily for 28 days., Disp: 36 tablet, Rfl: 0   atorvastatin (LIPITOR) 80 MG tablet, Take one tablet (80 mg dose) by mouth at bedtime.,  Disp: 30 tablet, Rfl: 0   buPROPion (WELLBUTRIN XL) 300 MG 24 hr tablet, Take 1 tablet (300 mg total) by mouth daily., Disp: 30 tablet, Rfl: 3   clonazePAM (KLONOPIN) 0.5 MG tablet, Take 1 tablet by mouth daily., Disp: 30 tablet, Rfl: 3   escitalopram (LEXAPRO) 10 MG tablet, Take 1 tablet (10 mg total) by mouth at bedtime., Disp: 30 tablet, Rfl: 0   escitalopram (LEXAPRO) 20 MG tablet, Take 1 tablet (20 mg total) by mouth daily., Disp: 30 tablet, Rfl: 2   levETIRAcetam (KEPPRA) 750 MG tablet, Take 750 mg by mouth 2 (two) times daily., Disp: , Rfl:    lisinopril (ZESTRIL) 40 MG tablet, TAKE 1 TABLET (40 MG TOTAL) BY MOUTH DAILY., Disp: 90 tablet, Rfl: 3   lithium 600 MG capsule, Take 1 capsule (600 mg total) by mouth at bedtime., Disp: 30 capsule, Rfl: 2   melatonin 3 MG TABS tablet, Take 3 mg by mouth at bedtime., Disp: , Rfl:    metoprolol tartrate (LOPRESSOR) 100 MG tablet, Take 1 tablet (100 mg total) by mouth as directed. 2 hours prior to CT scan., Disp: 1 tablet, Rfl: 0   nicotine polacrilex (NICORETTE) 2 MG gum, Take 1 (2 mg total) by mouth as needed for smoking cessation, Disp: 270 each, Rfl: 0   sildenafil (VIAGRA) 50 MG tablet, Take 1 tablet (50 mg total) by mouth daily as needed for erectile dysfunction., Disp: 10  tablet, Rfl: 0   clopidogrel (PLAVIX) 75 MG tablet, Take one tablet (75 mg dose) by mouth daily for 30 days. (Patient not taking: Reported on 08/07/2022), Disp: 30 tablet, Rfl: 0   cyclobenzaprine (FLEXERIL) 10 MG tablet, Take 1 tablet by mouth at bedtime as needed for neck pain or tension headache. (Patient not taking: Reported on 08/07/2022), Disp: 10 tablet, Rfl: 0   escitalopram (LEXAPRO) 10 MG tablet, Take 1 tablet (10 mg total) by mouth daily. (Patient not taking: Reported on 08/07/2022), Disp: 30 tablet, Rfl: 1   mirtazapine (REMERON) 15 MG tablet, Take 1 tablet (15 mg total) by mouth at bedtime., Disp: 30 tablet, Rfl: 2 Medication Side Effects: none  Family Medical/ Social  History: Changes? No  MENTAL HEALTH EXAM:  There were no vitals taken for this visit.There is no height or weight on file to calculate BMI.  General Appearance: Casual, Neat, and Well Groomed  Eye Contact:  Good  Speech:  Clear and Coherent  Volume:  Normal  Mood:  Anxious, Depressed, and Dysphoric  Affect:  Appropriate, Congruent, Depressed, Flat, Tearful, and Anxious  Thought Process:  Coherent  Orientation:  Full (Time, Place, and Person)  Thought Content: Logical   Suicidal Thoughts:  No  Homicidal Thoughts:  No  Memory:  WNL  Judgement:  Good  Insight:  Good  Psychomotor Activity:  Normal  Concentration:  Concentration: Good  Recall:  Good  Fund of Knowledge: Good  Language: Good  Assets:  Desire for Improvement  ADL's:  Intact  Cognition: WNL  Prognosis:  Good    DIAGNOSES:    ICD-10-CM   1. Generalized anxiety disorder  F41.1 escitalopram (LEXAPRO) 20 MG tablet    2. Grief at loss of child  F43.21 escitalopram (LEXAPRO) 20 MG tablet   Z63.4     3. PTSD (post-traumatic stress disorder)  F43.10 escitalopram (LEXAPRO) 20 MG tablet    4. Family problems  Z63.9     5. Suicidal ideation  R45.851 lithium 600 MG capsule    6. Major depressive disorder, recurrent episode, moderate (HCC)  F33.1 escitalopram (LEXAPRO) 20 MG tablet      Receiving Psychotherapy: No    RECOMMENDATIONS:  Greater than 50% of 30 min face to face visit with patient was spent on counseling and coordination of care.  He says he has not been good. Reviewed notes for another ER visit for SI on Dec 26th and discharge on 08/03/2022.  He says that he feels better but still very depressed with some passive SI.  He feels like the Lithium is helping with SI.  We agreed to adjust his SSRI and lithium today.  We also discussed his plan to possibly return to [place] to spend some time with his mother soon. He thought that this could help him. He agreed to notify office before leaving and to make sure he had  his medications with him. He believes he will have no problem receiving his medication there. I told him that we would need to check his lithium levels next visit in 4 weeks.  I reinforced with him the  resources that were available to him to include 911, going to the ER, calling a friend, suicide hotline, and continued care with his therapist.  He was agreeable and stated that he would follow instructions and compliance with taking his medication. He verbally contracts for safety and agrees to utilize resources if SI becomes severe.        Today we agreed to:  Continue Wellbutrin to 300 mg daily Continue Mirtazapine 15 mg at bedtime for sleep Continue the remaining Klonopin he has left only for severe anxiety. He is not using everyday.  Increase Lithium  to 600 mg daily at bedtime for SI Increase Lexapro to 20  mg daily for increased depression and anxiety Will report worsening symptoms promptly To follow up in 4 weeks to reassess To request lithium labs next visit Provided emergency contact information Discussed potential benefits, risk, and side effects of benzodiazepines to include potential risk of tolerance and dependence, as well as possible drowsiness.  Advised patient not to drive if experiencing drowsiness and to take lowest possible effective dose to minimize risk of dependence and tolerance.  Reviewed PDMP   [name], NP

## 2022-08-10 ENCOUNTER — Other Ambulatory Visit (HOSPITAL_BASED_OUTPATIENT_CLINIC_OR_DEPARTMENT_OTHER): Payer: Self-pay

## 2022-08-11 ENCOUNTER — Other Ambulatory Visit (HOSPITAL_BASED_OUTPATIENT_CLINIC_OR_DEPARTMENT_OTHER): Payer: Self-pay

## 2022-08-27 ENCOUNTER — Other Ambulatory Visit (HOSPITAL_BASED_OUTPATIENT_CLINIC_OR_DEPARTMENT_OTHER): Payer: Self-pay

## 2022-09-04 ENCOUNTER — Ambulatory Visit: Payer: Commercial Managed Care - PPO | Admitting: Behavioral Health

## 2022-09-07 DIAGNOSIS — F321 Major depressive disorder, single episode, moderate: Secondary | ICD-10-CM | POA: Diagnosis not present

## 2022-09-07 DIAGNOSIS — F4310 Post-traumatic stress disorder, unspecified: Secondary | ICD-10-CM | POA: Diagnosis not present

## 2022-09-07 DIAGNOSIS — F411 Generalized anxiety disorder: Secondary | ICD-10-CM | POA: Diagnosis not present

## 2022-09-08 ENCOUNTER — Ambulatory Visit (INDEPENDENT_AMBULATORY_CARE_PROVIDER_SITE_OTHER): Payer: Commercial Managed Care - PPO | Admitting: Behavioral Health

## 2022-09-08 DIAGNOSIS — F489 Nonpsychotic mental disorder, unspecified: Secondary | ICD-10-CM

## 2022-09-08 NOTE — Progress Notes (Signed)
[address] [name] [id number] Dec 20, 1980 42 y.o.  Virtual Visit via Video Note  I connected with pt @ on 08/07/22 at  3:00 PM EST by a video enabled telemedicine application and verified that I am speaking with the correct person using two identifiers.   I discussed the limitations of evaluation and management by telemedicine and the availability of in person appointments. The patient expressed understanding and agreed to proceed.  I discussed the assessment and treatment plan with the patient. The patient was provided an opportunity to ask questions and all were answered. The patient agreed with the plan and demonstrated an understanding of the instructions.   The patient was advised to call back or seek an in-person evaluation if the symptoms worsen or if the condition fails to improve as anticipated.  I provided 30 minutes of non-face-to-face time during this encounter.  The patient was located at home.  The provider was located at [place].   [name], NP   Subjective:   Patient ID:  Joshua Hansen is a 42 y.o. (DOB March 31, 1981) male.  Chief Complaint:  Chief Complaint  Patient presents with   Follow-up   Anxiety   Depression   Trauma   Patient Education   Medication Problem   Suicidal Ideation    HPI  42 year old male presents via video visit for follow up and medication management. No interpreter with him this visit.  He is flat and says he has not been doing well. He had another inpatient hospitalization on Dec. 26th. Says he is not suicidal today and feels safe. Feels like he has made some improvement. Says he is sad because his estranged wife will not let him see his daughter. She has stopped contact with him .  Says he is feeling positive and no SI in the last few days.  He says he may go to [place] to visit his mother because he feels it may be good for him. He understands the resources that are available.  He says his anxiety  today is 2/10 and depression 2/10.    He is sleeping 6-7 hours per night. He says that he realizes his mistakes and does not want to die.  Agrees to f/u in 4 weeks. Denies mania, no psychosis. No current SI, No HI. Agrees to continue working closely with therapist. Joshua Hansen says that he feels safe and this time and verbally contracted for safety. He is cooperative and has hx of asking for help when needed.    Prior psychiatric medication trial: Effexor Hydroxyzine   Review of Systems:  Review of Systems  Constitutional: Negative.   Allergic/Immunologic: Negative.   Psychiatric/Behavioral:  Positive for dysphoric mood. The patient is nervous/anxious.     Medications: I have reviewed the patient's current medications.  Current Outpatient Medications  Medication Sig Dispense Refill   amLODipine (NORVASC) 5 MG tablet Take 1 tablet (5 mg total) by mouth daily. 90 tablet 0   aspirin 81 MG chewable tablet Chew one tablet (81 mg dose) by mouth daily for 28 days. 36 tablet 0   atorvastatin (LIPITOR) 80 MG tablet Take one tablet (80 mg dose) by mouth at bedtime. 30 tablet 0   buPROPion (WELLBUTRIN XL) 300 MG 24 hr tablet Take 1 tablet (300 mg total) by mouth daily. 30 tablet 3   clonazePAM (KLONOPIN) 0.5 MG tablet Take 1 tablet by mouth daily. 30 tablet 3   escitalopram (LEXAPRO) 10 MG tablet Take 1 tablet (10 mg  total) by mouth at bedtime. 30 tablet 0   escitalopram (LEXAPRO) 20 MG tablet Take 1 tablet (20 mg total) by mouth daily. 30 tablet 2   levETIRAcetam (KEPPRA) 750 MG tablet Take 750 mg by mouth 2 (two) times daily.     lisinopril (ZESTRIL) 40 MG tablet TAKE 1 TABLET (40 MG TOTAL) BY MOUTH DAILY. 90 tablet 3   lithium 600 MG capsule Take 1 capsule (600 mg total) by mouth at bedtime. 30 capsule 2   melatonin 3 MG TABS tablet Take 3 mg by mouth at bedtime.     metoprolol tartrate (LOPRESSOR) 100 MG tablet Take 1 tablet (100 mg total) by mouth as directed. 2 hours prior to CT scan. 1 tablet 0    nicotine polacrilex (NICORETTE) 2 MG gum Take 1 (2 mg total) by mouth as needed for smoking cessation 270 each 0   sildenafil (VIAGRA) 50 MG tablet Take 1 tablet (50 mg total) by mouth daily as needed for erectile dysfunction. 10 tablet 0   clopidogrel (PLAVIX) 75 MG tablet Take one tablet (75 mg dose) by mouth daily for 30 days. (Patient not taking: Reported on 08/07/2022) 30 tablet 0   cyclobenzaprine (FLEXERIL) 10 MG tablet Take 1 tablet by mouth at bedtime as needed for neck pain or tension headache. (Patient not taking: Reported on 08/07/2022) 10 tablet 0   escitalopram (LEXAPRO) 10 MG tablet Take 1 tablet (10 mg total) by mouth daily. (Patient not taking: Reported on 08/07/2022) 30 tablet 1   mirtazapine (REMERON) 15 MG tablet Take 1 tablet (15 mg total) by mouth at bedtime. 30 tablet 2   No current facility-administered medications for this visit.    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   Essential hypertension 11/04/2021   Fracture of left humerus 08/07/2018   History of rheumatic fever 11/04/2021   Major depression, single episode 11/04/2021   Open fracture of left fibula and tibia 08/07/2018   Open fracture of left tibia 08/06/2018   Pain in limb 11/04/2021   Rheumatic fever    as child   Severe episode of recurrent major depressive disorder, without psychotic features ([place]) 06/14/2021   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   Vitamin D deficiency 11/04/2021    Family History  Problem Relation Age of Onset   Hyperlipidemia Mother    Hypertension Mother    Alcohol abuse Maternal Uncle     Social History   Socioeconomic History   Marital status: Married    Spouse name: Joshua Hansen   Number of children: 1    Years of education: 5th grade   Highest education level: Not on file  Occupational History   Occupation: Architect    Comment: Teaching laboratory technician  Tobacco Use   Smoking status: Former    Types: Cigarettes   Smokeless tobacco: Never  Vaping Use   Vaping Use: Never used  Substance and Sexual Activity   Alcohol use: Never   Drug use: Never   Sexual activity: Yes  Other Topics Concern   Not on file  Social History Narrative   Lives  in [place] with friend.  Spouse asked to leave after his ER visit and overdose. 4 days in hospital and then to treatment facility in [facility] name unknown,  but stayed 7 days.    Social Determinants of Health   Financial Resource Strain: Not on file  Food Insecurity: Not on file  Transportation Needs: Not on file  Physical Activity: Not on file  Stress: Not on file  Social Connections: Not on file  Intimate Partner Violence: Not on file    Past Medical History, Surgical history, Social history, and Family history were reviewed and updated as appropriate.   Please see review of systems for further details on the patient's review from today.   Objective:   Physical Exam:  There were no vitals taken for this visit.  Physical Exam Constitutional:      General: He is not in acute distress.    Appearance: Normal appearance.  Neurological:     Mental Status: He is alert and oriented to person, place, and time.     Gait: Gait normal.  Psychiatric:        Attention and Perception: Attention and perception normal. He does not perceive auditory or visual hallucinations.        Mood and Affect: Mood and affect normal. Mood is not anxious or depressed. Affect is not labile.        Speech: Speech normal.        Behavior: Behavior normal. Behavior is cooperative.        Thought Content: Thought content normal.        Cognition and Memory: Cognition and memory normal.        Judgment: Judgment normal.     Lab Review:     Component Value Date/Time   NA  139 10/25/2020 1543   K 3.8 10/25/2020 1543   CL 104 10/25/2020 1543   CO2 22 10/25/2020 1543   GLUCOSE 79 10/25/2020 1543   BUN 12 10/25/2020 1543   CREATININE 1.01 10/25/2020 1543   CALCIUM 9.5 10/25/2020 1543   PROT 7.0 10/25/2020 1543   ALBUMIN 4.8 01/03/2020 1154   AST 14 10/25/2020 1543   ALT 17 10/25/2020 1543   ALKPHOS 80 01/03/2020 1154   BILITOT 0.5 10/25/2020 1543   GFRNONAA >60 10/20/2020 2228   GFRAA >60 08/07/2018 0211       Component Value Date/Time   WBC 9.6 10/20/2020 2228   RBC 5.40 10/20/2020 2228   HGB 16.1 10/20/2020 2228   HCT 48.7 10/20/2020 2228   PLT 307 10/20/2020 2228   MCV 90.2 10/20/2020 2228   MCH 29.8 10/20/2020 2228   MCHC 33.1 10/20/2020 2228   RDW 13.5 10/20/2020 2228   LYMPHSABS 3.5 10/20/2020 2228   MONOABS 1.0 10/20/2020 2228   EOSABS 0.0 10/20/2020 2228   BASOSABS 0.1 10/20/2020 2228    No results found for: "POCLITH", "LITHIUM"   No results found for: "PHENYTOIN", "PHENOBARB", "VALPROATE", "CBMZ"   .res Assessment: Plan:    RECOMMENDATIONS:   Greater than 50% of 30 min face to face visit with patient was spent on counseling and coordination of care.  He says he has not been good. Reviewed notes for another ER visit for SI on Dec 26th and discharge on 08/03/2022.  He says that he feels better but still very depressed with some passive SI.  He feels like the Lithium is helping with SI.  We agreed to adjust his SSRI and lithium today.  We also discussed his plan to possibly return to [place] to spend some time with his mother soon. He thought that this could help him. He agreed to notify office before leaving and to  make sure he had his medications with him. He believes he will have no problem receiving his medication there. I told him that we would need to check his lithium levels next visit in 4 weeks.  I reinforced with him the  resources that were available to him to include 911, going to the ER, calling a friend, suicide hotline, and  continued care with his therapist.  He was agreeable and stated that he would follow instructions and compliance with taking his medication. He verbally contracts for safety and agrees to utilize resources if SI becomes severe.      Aaron Edelman A. Daimon Kean, NP  Kannon was seen today for follow-up, anxiety, depression, trauma, patient education, medication problem and suicidal ideation.  Diagnoses and all orders for this visit:  Generalized anxiety disorder -     escitalopram (LEXAPRO) 20 MG tablet; Take 1 tablet (20 mg total) by mouth daily.  Grief at loss of child -     escitalopram (LEXAPRO) 20 MG tablet; Take 1 tablet (20 mg total) by mouth daily.  PTSD (post-traumatic stress disorder) -     escitalopram (LEXAPRO) 20 MG tablet; Take 1 tablet (20 mg total) by mouth daily.  Family problems  Suicidal ideation -     lithium 600 MG capsule; Take 1 capsule (600 mg total) by mouth at bedtime.  Major depressive disorder, recurrent episode, moderate (HCC) -     escitalopram (LEXAPRO) 20 MG tablet; Take 1 tablet (20 mg total) by mouth daily.  Other orders -     mirtazapine (REMERON) 15 MG tablet; Take 1 tablet (15 mg total) by mouth at bedtime.     Please see After Visit Summary for patient specific instructions.  No future appointments.  No orders of the defined types were placed in this encounter.     -------------------------------

## 2022-09-08 NOTE — Progress Notes (Signed)
Pt did not show for scheduled appointment and did not provide 24 hour notice as required. Additional fees to be assessed. 

## 2022-09-11 DIAGNOSIS — F4310 Post-traumatic stress disorder, unspecified: Secondary | ICD-10-CM | POA: Diagnosis not present

## 2022-09-11 DIAGNOSIS — F411 Generalized anxiety disorder: Secondary | ICD-10-CM | POA: Diagnosis not present

## 2022-09-11 DIAGNOSIS — F321 Major depressive disorder, single episode, moderate: Secondary | ICD-10-CM | POA: Diagnosis not present

## 2022-09-18 DIAGNOSIS — F4310 Post-traumatic stress disorder, unspecified: Secondary | ICD-10-CM | POA: Diagnosis not present

## 2022-09-18 DIAGNOSIS — F411 Generalized anxiety disorder: Secondary | ICD-10-CM | POA: Diagnosis not present

## 2022-09-18 DIAGNOSIS — F321 Major depressive disorder, single episode, moderate: Secondary | ICD-10-CM | POA: Diagnosis not present

## 2022-10-02 DIAGNOSIS — Z6829 Body mass index (BMI) 29.0-29.9, adult: Secondary | ICD-10-CM | POA: Diagnosis not present

## 2022-10-02 DIAGNOSIS — F332 Major depressive disorder, recurrent severe without psychotic features: Secondary | ICD-10-CM | POA: Diagnosis not present

## 2022-10-02 DIAGNOSIS — I10 Essential (primary) hypertension: Secondary | ICD-10-CM | POA: Diagnosis not present

## 2022-10-14 ENCOUNTER — Other Ambulatory Visit: Payer: Self-pay | Admitting: Medical

## 2022-10-15 ENCOUNTER — Other Ambulatory Visit (HOSPITAL_BASED_OUTPATIENT_CLINIC_OR_DEPARTMENT_OTHER): Payer: Self-pay

## 2022-10-15 ENCOUNTER — Other Ambulatory Visit: Payer: Self-pay

## 2022-10-15 MED ORDER — SILDENAFIL CITRATE 50 MG PO TABS
50.0000 mg | ORAL_TABLET | Freq: Every day | ORAL | 0 refills | Status: DC | PRN
  Filled 2022-10-15: qty 4, 30d supply, fill #0
  Filled 2022-11-07: qty 4, 30d supply, fill #1
  Filled 2022-12-03: qty 4, 30d supply, fill #2

## 2022-10-15 MED ORDER — AMLODIPINE BESYLATE 5 MG PO TABS
5.0000 mg | ORAL_TABLET | Freq: Every day | ORAL | 0 refills | Status: DC
  Filled 2022-10-15: qty 90, 90d supply, fill #0

## 2022-11-09 ENCOUNTER — Other Ambulatory Visit: Payer: Self-pay

## 2022-11-09 ENCOUNTER — Other Ambulatory Visit (HOSPITAL_BASED_OUTPATIENT_CLINIC_OR_DEPARTMENT_OTHER): Payer: Self-pay

## 2022-12-03 ENCOUNTER — Other Ambulatory Visit: Payer: Self-pay | Admitting: Medical

## 2022-12-03 ENCOUNTER — Other Ambulatory Visit (HOSPITAL_BASED_OUTPATIENT_CLINIC_OR_DEPARTMENT_OTHER): Payer: Self-pay

## 2022-12-03 MED ORDER — SILDENAFIL CITRATE 50 MG PO TABS
50.0000 mg | ORAL_TABLET | Freq: Every day | ORAL | 0 refills | Status: DC | PRN
  Filled 2022-12-03: qty 10, 10d supply, fill #0

## 2022-12-23 ENCOUNTER — Other Ambulatory Visit: Payer: Self-pay | Admitting: Medical

## 2022-12-23 ENCOUNTER — Other Ambulatory Visit (HOSPITAL_BASED_OUTPATIENT_CLINIC_OR_DEPARTMENT_OTHER): Payer: Self-pay

## 2022-12-24 ENCOUNTER — Other Ambulatory Visit: Payer: Self-pay | Admitting: Medical

## 2022-12-25 ENCOUNTER — Other Ambulatory Visit (HOSPITAL_BASED_OUTPATIENT_CLINIC_OR_DEPARTMENT_OTHER): Payer: Self-pay

## 2022-12-25 MED ORDER — SILDENAFIL CITRATE 50 MG PO TABS
50.0000 mg | ORAL_TABLET | Freq: Every day | ORAL | 0 refills | Status: DC | PRN
  Filled 2022-12-25 – 2023-01-13 (×2): qty 10, 10d supply, fill #0

## 2023-01-05 ENCOUNTER — Other Ambulatory Visit (HOSPITAL_BASED_OUTPATIENT_CLINIC_OR_DEPARTMENT_OTHER): Payer: Self-pay

## 2023-01-13 ENCOUNTER — Other Ambulatory Visit: Payer: Self-pay | Admitting: Behavioral Health

## 2023-01-13 ENCOUNTER — Other Ambulatory Visit: Payer: Self-pay

## 2023-01-13 ENCOUNTER — Other Ambulatory Visit (HOSPITAL_BASED_OUTPATIENT_CLINIC_OR_DEPARTMENT_OTHER): Payer: Self-pay

## 2023-01-13 DIAGNOSIS — F331 Major depressive disorder, recurrent, moderate: Secondary | ICD-10-CM

## 2023-01-13 DIAGNOSIS — F4321 Adjustment disorder with depressed mood: Secondary | ICD-10-CM

## 2023-01-13 DIAGNOSIS — F411 Generalized anxiety disorder: Secondary | ICD-10-CM

## 2023-01-15 ENCOUNTER — Other Ambulatory Visit (HOSPITAL_BASED_OUTPATIENT_CLINIC_OR_DEPARTMENT_OTHER): Payer: Self-pay

## 2023-01-15 NOTE — Telephone Encounter (Signed)
Sent MyChart message asking to schedule FU

## 2023-01-19 ENCOUNTER — Other Ambulatory Visit (HOSPITAL_BASED_OUTPATIENT_CLINIC_OR_DEPARTMENT_OTHER): Payer: Self-pay

## 2023-02-11 ENCOUNTER — Other Ambulatory Visit: Payer: Self-pay | Admitting: Behavioral Health

## 2023-02-11 ENCOUNTER — Other Ambulatory Visit (HOSPITAL_BASED_OUTPATIENT_CLINIC_OR_DEPARTMENT_OTHER): Payer: Self-pay

## 2023-02-11 ENCOUNTER — Other Ambulatory Visit: Payer: Self-pay | Admitting: Medical

## 2023-02-11 DIAGNOSIS — F411 Generalized anxiety disorder: Secondary | ICD-10-CM

## 2023-02-11 DIAGNOSIS — F331 Major depressive disorder, recurrent, moderate: Secondary | ICD-10-CM

## 2023-02-11 DIAGNOSIS — F4321 Adjustment disorder with depressed mood: Secondary | ICD-10-CM

## 2023-02-11 MED ORDER — AMLODIPINE BESYLATE 5 MG PO TABS
5.0000 mg | ORAL_TABLET | Freq: Every day | ORAL | 0 refills | Status: DC
  Filled 2023-02-11 (×2): qty 30, 30d supply, fill #0

## 2023-02-12 ENCOUNTER — Other Ambulatory Visit (HOSPITAL_BASED_OUTPATIENT_CLINIC_OR_DEPARTMENT_OTHER): Payer: Self-pay

## 2023-02-12 ENCOUNTER — Encounter (HOSPITAL_BASED_OUTPATIENT_CLINIC_OR_DEPARTMENT_OTHER): Payer: Self-pay

## 2023-02-16 ENCOUNTER — Other Ambulatory Visit: Payer: Self-pay | Admitting: Medical

## 2023-02-16 ENCOUNTER — Other Ambulatory Visit (HOSPITAL_BASED_OUTPATIENT_CLINIC_OR_DEPARTMENT_OTHER): Payer: Self-pay

## 2023-02-16 MED ORDER — SILDENAFIL CITRATE 50 MG PO TABS
50.0000 mg | ORAL_TABLET | Freq: Every day | ORAL | 0 refills | Status: DC | PRN
  Filled 2023-02-16: qty 10, 10d supply, fill #0

## 2023-02-22 ENCOUNTER — Ambulatory Visit: Payer: Self-pay | Admitting: Medical

## 2023-03-09 ENCOUNTER — Other Ambulatory Visit: Payer: Self-pay | Admitting: Behavioral Health

## 2023-03-09 ENCOUNTER — Other Ambulatory Visit: Payer: Self-pay | Admitting: Medical

## 2023-03-09 DIAGNOSIS — F331 Major depressive disorder, recurrent, moderate: Secondary | ICD-10-CM

## 2023-03-09 DIAGNOSIS — F4321 Adjustment disorder with depressed mood: Secondary | ICD-10-CM

## 2023-03-09 DIAGNOSIS — F411 Generalized anxiety disorder: Secondary | ICD-10-CM

## 2023-03-10 ENCOUNTER — Other Ambulatory Visit: Payer: Self-pay

## 2023-03-10 ENCOUNTER — Other Ambulatory Visit (HOSPITAL_BASED_OUTPATIENT_CLINIC_OR_DEPARTMENT_OTHER): Payer: Self-pay

## 2023-03-10 MED ORDER — SILDENAFIL CITRATE 50 MG PO TABS
50.0000 mg | ORAL_TABLET | Freq: Every day | ORAL | 0 refills | Status: DC | PRN
  Filled 2023-03-10: qty 10, 10d supply, fill #0

## 2023-03-12 ENCOUNTER — Other Ambulatory Visit (HOSPITAL_BASED_OUTPATIENT_CLINIC_OR_DEPARTMENT_OTHER): Payer: Self-pay

## 2023-03-15 ENCOUNTER — Ambulatory Visit: Payer: BC Managed Care – PPO | Admitting: Medical

## 2023-03-15 ENCOUNTER — Other Ambulatory Visit (HOSPITAL_BASED_OUTPATIENT_CLINIC_OR_DEPARTMENT_OTHER): Payer: Self-pay

## 2023-03-15 VITALS — BP 133/87 | HR 88 | Ht 69.0 in | Wt 202.6 lb

## 2023-03-15 DIAGNOSIS — G44219 Episodic tension-type headache, not intractable: Secondary | ICD-10-CM | POA: Diagnosis not present

## 2023-03-15 MED ORDER — CYCLOBENZAPRINE HCL 5 MG PO TABS
5.0000 mg | ORAL_TABLET | Freq: Every evening | ORAL | 0 refills | Status: AC | PRN
Start: 2023-03-15 — End: ?
  Filled 2023-03-15 – 2023-03-31 (×2): qty 5, 5d supply, fill #0

## 2023-03-15 NOTE — Progress Notes (Signed)
Subjective:    Patient ID: Cassandra Onderdonk, male    DOB: 1981-05-31, 42 y.o.   MRN: [id number]  HPI  Discussed the use of AI scribe software for clinical note transcription with the patient, who gave verbal consent to proceed.  History of Present Illness   The patient, with a history of depression, has been experiencing headaches for the past three weeks. The pain typically begins around noon and is located in the back of the head, often accompanied by neck pain. The patient notes that the headaches may be associated with stress. During these episodes, the patient experiences transient blurred vision lasting for minutes, and the headache persists for hours. The patient has been managing the pain with 500mg  of Tylenol, which provides some relief. The patient denies photophobia and phonophobia but does report occasional nausea with the headaches. The headaches occur two to three times a week, including on weekends. some ha late afternoon. No gross motor or sensory deficits.   The patient's depression, for which they were hospitalized in December, has been well-controlled on Wellbutrin 300mg  daily. They are under the care of a psychiatrist, with a follow-up appointment scheduled in three weeks. The patient has not previously seen an ophthalmologist for their transient blurred vision.         Review of Systems  Constitutional:  Negative for chills, fatigue and fever.  HENT:  Negative for congestion and drooling.   Respiratory:  Negative for cough, chest tightness, wheezing and stridor.   Cardiovascular:  Negative for chest pain and palpitations.  Gastrointestinal:  Negative for abdominal pain.  Musculoskeletal:  Negative for back pain, joint swelling and neck stiffness.  Skin:  Negative for rash.  Neurological:  Positive for headaches. Negative for dizziness.       No ha presently. See hpi.  Hematological:  Negative for adenopathy.  Psychiatric/Behavioral:  Negative for  behavioral problems and dysphoric mood.    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   Essential hypertension 11/04/2021   Fracture of left humerus 08/07/2018   History of rheumatic fever 11/04/2021   Major depression, single episode 11/04/2021   Open fracture of left fibula and tibia 08/07/2018   Open fracture of left tibia 08/06/2018   Pain in limb 11/04/2021   Rheumatic fever    as child   Severe episode of recurrent major depressive disorder, without psychotic features (HCC) 06/14/2021   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   Vitamin D deficiency 11/04/2021     Social History   Socioeconomic History   Marital status: Married    Spouse name: Lance Bosch   Number of children: 1   Years of education: 5th grade   Highest education level: Not on file  Occupational History   Occupation: Holiday representative    Comment: Pensions consultant  Tobacco Use   Smoking status: Former    Types: Cigarettes   Smokeless tobacco: Never  Vaping Use   Vaping status: Never Used  Substance and Sexual Activity   Alcohol use: Never   Drug use: Never   Sexual activity: Yes  Other Topics Concern   Not on file  Social History Narrative   Lives  in [place] with friend.  Spouse asked to leave after his ER visit and overdose. 4 days in hospital and then to treatment facility [place] name unknown, but stayed 7 days.    Social Determinants of Health   Financial Resource Strain: Low Risk  (06/25/2022)   Received [facility], [facility]   Overall Financial Resource Strain (CARDIA)    Difficulty of Paying Living Expenses: Not very hard  Food Insecurity: Low Risk  (07/28/2022)   [facility]   Food vital sign    Within the past 12 months, you worried that your food would  run out before you got money to buy more: Never true    Within the past 12 months, the food you bought just didn't last and you didn't have money to get more: Not on file  Transportation Needs: No Transportation Needs (07/28/2022)   [facility]   Transportation    In the past 12 months, has lack of reliable transportation kept you from medical appointments, meetings, work or from getting things needed for daily living? : No  Physical Activity: Not on file  Stress: Stress Concern Present (06/25/2022)   Received from [facility], [facility]   [employer] [facility] - Occupational Stress Questionnaire    Feeling of Stress : Rather much  Social Connections: Unknown (12/15/2021)   Received from [facility], [facility]   Social Network    Social Network: Not on file  Intimate Partner Violence: Low Risk  (07/28/2022)   [facility] prior to 10/03/2022., [facility] visits prior to 10/03/2022.   Safety    How often does anyone, including family and friends, physically hurt you?: Never    How often does anyone, including family and friends, insult or talk down to you?: Never    How often does anyone, including family and friends, threaten you with harm?: Never    How often does anyone, including family and friends, scream or curse at you?: Never    Past Surgical History:  Procedure Laterality Date   I & D EXTREMITY Left 08/06/2018   Procedure: IRRIGATION AND DEBRIDEMENT EXTREMITY;  Surgeon: Yolonda Kida, MD;  Location[facility];  Service: Orthopedics;  Laterality: Left;   TIBIA IM NAIL INSERTION Left 08/06/2018   Procedure: INTRAMEDULLARY (IM) NAIL TIBIAL;  Surgeon: Yolonda Kida, MD;  Location[facility] OR;  Service: Orthopedics;  Laterality: Left;    Family History  Problem Relation Age of Onset   Hyperlipidemia Mother    Hypertension Mother    Alcohol abuse Maternal  Uncle     No Known Allergies  Current Outpatient Medications on File Prior to Visit  Medication Sig Dispense Refill   amLODipine (NORVASC) 5 MG tablet Take 1 tablet (5 mg total) by mouth daily. 30 tablet 0   aspirin 81 MG chewable tablet Chew one tablet (81 mg dose) by mouth daily for 28 days. 36 tablet 0   atorvastatin (LIPITOR) 80 MG tablet Take one tablet (80 mg dose) by mouth at bedtime. 30 tablet 0   buPROPion (WELLBUTRIN XL) 300 MG 24 hr tablet Take 1 tablet (300 mg total) by mouth daily. 30 tablet 3   clonazePAM (KLONOPIN) 0.5 MG tablet Take 1 tablet by mouth daily. 30 tablet 3   clopidogrel (PLAVIX) 75 MG tablet Take one tablet (75 mg dose) by mouth daily for 30 days. 30 tablet 0   cyclobenzaprine (FLEXERIL) 10 MG tablet Take 1 tablet by mouth at bedtime as needed for neck pain or tension headache. 10 tablet  0   escitalopram (LEXAPRO) 10 MG tablet Take 1 tablet (10 mg total) by mouth daily. 30 tablet 1   escitalopram (LEXAPRO) 10 MG tablet Take 1 tablet (10 mg total) by mouth at bedtime. 30 tablet 0   escitalopram (LEXAPRO) 20 MG tablet Take 1 tablet (20 mg total) by mouth daily. 30 tablet 2   levETIRAcetam (KEPPRA) 750 MG tablet Take 750 mg by mouth 2 (two) times daily.     lisinopril (ZESTRIL) 40 MG tablet TAKE 1 TABLET (40 MG TOTAL) BY MOUTH DAILY. 90 tablet 3   lithium 600 MG capsule Take 1 capsule (600 mg total) by mouth at bedtime. 30 capsule 2   melatonin 3 MG TABS tablet Take 3 mg by mouth at bedtime.     mirtazapine (REMERON) 15 MG tablet Take 1 tablet (15 mg total) by mouth at bedtime. 30 tablet 2   nicotine polacrilex (NICORETTE) 2 MG gum Take 1 (2 mg total) by mouth as needed for smoking cessation 270 each 0   sildenafil (VIAGRA) 50 MG tablet Take 1 tablet (50 mg total) by mouth daily as needed for erectile dysfunction. 10 tablet 0   No current facility-administered medications on file prior to visit.    BP 133/87   Pulse 88   Ht 5\' 9"  (1.753 m)   Wt 202 lb 9.6 oz  (91.9 kg)   SpO2 97%   BMI 29.92 kg/m        Objective:   Physical Exam  General Mental Status- Alert. General Appearance- Not in acute distress.   Skin General: Color- Normal Color. Moisture- Normal Moisture.  Neck Left side trapezius mld tender as inserts into occipital area.  Chest and Lung Exam Auscultation: Breath Sounds:-Normal.  Cardiovascular Auscultation:Rythm- Regular. Murmurs & Other Heart Sounds:Auscultation of the heart reveals- No Murmurs.  Abdomen Inspection:-Inspeection Normal. Palpation/Percussion:Note:No mass. Palpation and Percussion of the abdomen reveal- Non Tender, Non Distended + BS, no rebound or guarding.    Neurologic Cranial Nerve exam:- CN III-XII intact(No nystagmus), symmetric smile. Drift Test:- No drift. Romberg Exam:- Negative.  Heal to Toe Gait exam:-Normal. Finger to Nose:- Normal/Intact Strength:- 5/5 equal and symmetric strength both upper and lower extremities.       Assessment & Plan:   Assessment and Plan    Tension Headache Headaches for the past three weeks, occurring 2-3 times per week, associated with neck pain, stress, and transient blurred vision. No photophobia or phonophobia. Some episodes associated with nausea. Partial relief with Tylenol 500mg . -Combine Tylenol 500mg  and Ibuprofen 200-400mg  for pain relief. -Start Flexeril 5mg  for muscle relaxation. -Refer to optometrist for vision assessment.   -normal neuro exam. follow closely and may order imaging studies in future. not indicated presently. Educated/explained  on signs/symptoms that would indicate need for ED evaluation.  Depression Stable on Wellbutrin 300mg  daily. Regular follow-up with psychiatrist. -Continue Wellbutrin 300mg  daily. -Follow-up with psychiatrist in three weeks.   Follow up 2 weeks wellness exam.       Bp controlled but did explain if get ha good idea to check bp level. Glad to hear have machine at home.  Esperanza Richters, PA-C

## 2023-03-15 NOTE — Patient Instructions (Signed)
Tension Headache Headaches for the past three weeks, occurring 2-3 times per week, associated with neck pain, stress, and transient blurred vision. No photophobia or phonophobia. Some episodes associated with nausea. Partial relief with Tylenol 500mg . -Combine Tylenol 500mg  and Ibuprofen 200-400mg  for pain relief. -Start Flexeril 5mg  for muscle relaxation. -Refer to optometrist for vision assessment.   -normal neuro exam. follow closely and may order imaging studies in future. not indicated presently. Educated/explained  on signs/symptoms that would indicate need for ED evaluation.  Depression Stable on Wellbutrin 300mg  daily. Regular follow-up with psychiatrist. -Continue Wellbutrin 300mg  daily. -Follow-up with psychiatrist in three weeks.   Follow up 2 weeks wellness exam.

## 2023-03-29 ENCOUNTER — Other Ambulatory Visit (HOSPITAL_BASED_OUTPATIENT_CLINIC_OR_DEPARTMENT_OTHER): Payer: Self-pay

## 2023-03-31 ENCOUNTER — Other Ambulatory Visit (HOSPITAL_BASED_OUTPATIENT_CLINIC_OR_DEPARTMENT_OTHER): Payer: Self-pay

## 2023-03-31 ENCOUNTER — Other Ambulatory Visit: Payer: Self-pay

## 2023-03-31 ENCOUNTER — Other Ambulatory Visit: Payer: Self-pay | Admitting: Medical

## 2023-03-31 MED ORDER — SILDENAFIL CITRATE 50 MG PO TABS
50.0000 mg | ORAL_TABLET | Freq: Every day | ORAL | 0 refills | Status: DC | PRN
  Filled 2023-03-31: qty 10, 10d supply, fill #0

## 2023-03-31 MED ORDER — AMLODIPINE BESYLATE 5 MG PO TABS
5.0000 mg | ORAL_TABLET | Freq: Every day | ORAL | 0 refills | Status: DC
  Filled 2023-03-31: qty 30, 30d supply, fill #0

## 2023-04-21 ENCOUNTER — Other Ambulatory Visit: Payer: Self-pay | Admitting: Medical

## 2023-04-21 ENCOUNTER — Other Ambulatory Visit (HOSPITAL_BASED_OUTPATIENT_CLINIC_OR_DEPARTMENT_OTHER): Payer: Self-pay

## 2023-04-21 ENCOUNTER — Other Ambulatory Visit: Payer: Self-pay | Admitting: Behavioral Health

## 2023-04-21 DIAGNOSIS — F4321 Adjustment disorder with depressed mood: Secondary | ICD-10-CM

## 2023-04-21 DIAGNOSIS — F411 Generalized anxiety disorder: Secondary | ICD-10-CM

## 2023-04-21 DIAGNOSIS — F331 Major depressive disorder, recurrent, moderate: Secondary | ICD-10-CM

## 2023-04-21 MED ORDER — SILDENAFIL CITRATE 50 MG PO TABS
50.0000 mg | ORAL_TABLET | Freq: Every day | ORAL | 0 refills | Status: DC | PRN
  Filled 2023-04-21: qty 10, 10d supply, fill #0

## 2023-04-21 MED ORDER — LISINOPRIL 40 MG PO TABS
40.0000 mg | ORAL_TABLET | Freq: Every day | ORAL | 0 refills | Status: DC
  Filled 2023-04-21: qty 90, 90d supply, fill #0

## 2023-04-22 ENCOUNTER — Other Ambulatory Visit: Payer: Self-pay

## 2023-04-23 ENCOUNTER — Other Ambulatory Visit (HOSPITAL_BASED_OUTPATIENT_CLINIC_OR_DEPARTMENT_OTHER): Payer: Self-pay

## 2023-05-11 ENCOUNTER — Other Ambulatory Visit (HOSPITAL_BASED_OUTPATIENT_CLINIC_OR_DEPARTMENT_OTHER): Payer: Self-pay

## 2023-05-11 ENCOUNTER — Other Ambulatory Visit: Payer: Self-pay | Admitting: Medical

## 2023-05-11 MED ORDER — AMLODIPINE BESYLATE 5 MG PO TABS
5.0000 mg | ORAL_TABLET | Freq: Every day | ORAL | 0 refills | Status: DC
  Filled 2023-05-11: qty 30, 30d supply, fill #0

## 2023-05-11 MED ORDER — SILDENAFIL CITRATE 50 MG PO TABS
50.0000 mg | ORAL_TABLET | Freq: Every day | ORAL | 0 refills | Status: DC | PRN
  Filled 2023-05-11: qty 10, 10d supply, fill #0

## 2023-05-20 ENCOUNTER — Other Ambulatory Visit (HOSPITAL_BASED_OUTPATIENT_CLINIC_OR_DEPARTMENT_OTHER): Payer: Self-pay

## 2023-05-27 ENCOUNTER — Other Ambulatory Visit: Payer: Self-pay | Admitting: Behavioral Health

## 2023-05-27 ENCOUNTER — Other Ambulatory Visit: Payer: Self-pay | Admitting: Medical

## 2023-05-27 DIAGNOSIS — F411 Generalized anxiety disorder: Secondary | ICD-10-CM

## 2023-05-28 ENCOUNTER — Other Ambulatory Visit (HOSPITAL_BASED_OUTPATIENT_CLINIC_OR_DEPARTMENT_OTHER): Payer: Self-pay

## 2023-05-28 MED ORDER — SILDENAFIL CITRATE 50 MG PO TABS
50.0000 mg | ORAL_TABLET | Freq: Every day | ORAL | 0 refills | Status: DC | PRN
  Filled 2023-05-28 – 2023-06-12 (×2): qty 10, 10d supply, fill #0

## 2023-06-08 ENCOUNTER — Other Ambulatory Visit (HOSPITAL_BASED_OUTPATIENT_CLINIC_OR_DEPARTMENT_OTHER): Payer: Self-pay

## 2023-06-12 ENCOUNTER — Other Ambulatory Visit: Payer: Self-pay | Admitting: Medical

## 2023-06-14 ENCOUNTER — Other Ambulatory Visit (HOSPITAL_BASED_OUTPATIENT_CLINIC_OR_DEPARTMENT_OTHER): Payer: Self-pay

## 2023-06-14 ENCOUNTER — Other Ambulatory Visit: Payer: Self-pay

## 2023-06-14 MED ORDER — AMLODIPINE BESYLATE 5 MG PO TABS
5.0000 mg | ORAL_TABLET | Freq: Every day | ORAL | 0 refills | Status: DC
  Filled 2023-06-14: qty 30, 30d supply, fill #0

## 2023-06-17 ENCOUNTER — Other Ambulatory Visit: Payer: Self-pay | Admitting: Medical

## 2023-06-17 ENCOUNTER — Other Ambulatory Visit: Payer: Self-pay | Admitting: Behavioral Health

## 2023-06-17 DIAGNOSIS — F411 Generalized anxiety disorder: Secondary | ICD-10-CM

## 2023-06-18 ENCOUNTER — Other Ambulatory Visit (HOSPITAL_BASED_OUTPATIENT_CLINIC_OR_DEPARTMENT_OTHER): Payer: Self-pay

## 2023-06-18 MED ORDER — SILDENAFIL CITRATE 50 MG PO TABS
50.0000 mg | ORAL_TABLET | Freq: Every day | ORAL | 0 refills | Status: DC | PRN
  Filled 2023-06-18 – 2023-06-21 (×2): qty 10, 10d supply, fill #0

## 2023-06-21 ENCOUNTER — Other Ambulatory Visit (HOSPITAL_BASED_OUTPATIENT_CLINIC_OR_DEPARTMENT_OTHER): Payer: Self-pay

## 2023-07-12 ENCOUNTER — Other Ambulatory Visit: Payer: Self-pay

## 2023-07-12 ENCOUNTER — Other Ambulatory Visit (HOSPITAL_BASED_OUTPATIENT_CLINIC_OR_DEPARTMENT_OTHER): Payer: Self-pay

## 2023-07-12 ENCOUNTER — Other Ambulatory Visit: Payer: Self-pay | Admitting: Behavioral Health

## 2023-07-12 ENCOUNTER — Other Ambulatory Visit: Payer: Self-pay | Admitting: Medical

## 2023-07-12 DIAGNOSIS — F411 Generalized anxiety disorder: Secondary | ICD-10-CM

## 2023-07-12 MED ORDER — SILDENAFIL CITRATE 50 MG PO TABS
50.0000 mg | ORAL_TABLET | Freq: Every day | ORAL | 0 refills | Status: DC | PRN
  Filled 2023-07-12 – 2023-07-22 (×2): qty 10, 10d supply, fill #0

## 2023-07-12 MED ORDER — AMLODIPINE BESYLATE 5 MG PO TABS
5.0000 mg | ORAL_TABLET | Freq: Every day | ORAL | 0 refills | Status: DC
  Filled 2023-07-12 – 2023-07-22 (×2): qty 30, 30d supply, fill #0

## 2023-07-13 ENCOUNTER — Other Ambulatory Visit (HOSPITAL_BASED_OUTPATIENT_CLINIC_OR_DEPARTMENT_OTHER): Payer: Self-pay

## 2023-07-22 ENCOUNTER — Other Ambulatory Visit (HOSPITAL_BASED_OUTPATIENT_CLINIC_OR_DEPARTMENT_OTHER): Payer: Self-pay

## 2023-08-08 ENCOUNTER — Other Ambulatory Visit: Payer: Self-pay | Admitting: Medical

## 2023-08-09 ENCOUNTER — Other Ambulatory Visit (HOSPITAL_BASED_OUTPATIENT_CLINIC_OR_DEPARTMENT_OTHER): Payer: Self-pay

## 2023-08-09 MED ORDER — SILDENAFIL CITRATE 50 MG PO TABS
50.0000 mg | ORAL_TABLET | Freq: Every day | ORAL | 0 refills | Status: DC | PRN
  Filled 2023-08-09: qty 10, 30d supply, fill #0

## 2023-08-16 ENCOUNTER — Other Ambulatory Visit: Payer: Self-pay | Admitting: Medical

## 2023-08-17 ENCOUNTER — Other Ambulatory Visit (HOSPITAL_BASED_OUTPATIENT_CLINIC_OR_DEPARTMENT_OTHER): Payer: Self-pay

## 2023-08-17 MED ORDER — LISINOPRIL 40 MG PO TABS
40.0000 mg | ORAL_TABLET | Freq: Every day | ORAL | 0 refills | Status: DC
  Filled 2023-08-17: qty 90, 90d supply, fill #0

## 2023-09-07 ENCOUNTER — Other Ambulatory Visit: Payer: Self-pay | Admitting: Medical

## 2023-09-08 ENCOUNTER — Other Ambulatory Visit (HOSPITAL_BASED_OUTPATIENT_CLINIC_OR_DEPARTMENT_OTHER): Payer: Self-pay

## 2023-09-08 MED ORDER — SILDENAFIL CITRATE 50 MG PO TABS
50.0000 mg | ORAL_TABLET | Freq: Every day | ORAL | 0 refills | Status: DC | PRN
  Filled 2023-09-08 – 2023-09-09 (×2): qty 10, 10d supply, fill #0

## 2023-09-08 MED ORDER — LISINOPRIL 40 MG PO TABS
40.0000 mg | ORAL_TABLET | Freq: Every day | ORAL | 0 refills | Status: DC
  Filled 2023-09-08 – 2024-01-17 (×3): qty 90, 90d supply, fill #0

## 2023-09-09 ENCOUNTER — Other Ambulatory Visit: Payer: Self-pay | Admitting: Medical

## 2023-09-09 ENCOUNTER — Other Ambulatory Visit: Payer: Self-pay

## 2023-09-09 ENCOUNTER — Other Ambulatory Visit (HOSPITAL_BASED_OUTPATIENT_CLINIC_OR_DEPARTMENT_OTHER): Payer: Self-pay

## 2023-09-15 ENCOUNTER — Other Ambulatory Visit (HOSPITAL_BASED_OUTPATIENT_CLINIC_OR_DEPARTMENT_OTHER): Payer: Self-pay

## 2023-09-15 ENCOUNTER — Other Ambulatory Visit: Payer: Self-pay | Admitting: Medical

## 2023-09-20 ENCOUNTER — Other Ambulatory Visit: Payer: Self-pay | Admitting: Medical

## 2023-09-21 ENCOUNTER — Other Ambulatory Visit (HOSPITAL_BASED_OUTPATIENT_CLINIC_OR_DEPARTMENT_OTHER): Payer: Self-pay

## 2023-09-27 ENCOUNTER — Other Ambulatory Visit: Payer: Self-pay | Admitting: Medical

## 2023-09-28 ENCOUNTER — Other Ambulatory Visit (HOSPITAL_BASED_OUTPATIENT_CLINIC_OR_DEPARTMENT_OTHER): Payer: Self-pay

## 2023-09-28 ENCOUNTER — Other Ambulatory Visit: Payer: Self-pay | Admitting: Medical

## 2023-09-30 ENCOUNTER — Other Ambulatory Visit (HOSPITAL_BASED_OUTPATIENT_CLINIC_OR_DEPARTMENT_OTHER): Payer: Self-pay

## 2023-10-06 ENCOUNTER — Other Ambulatory Visit: Payer: Self-pay | Admitting: Medical

## 2023-10-06 ENCOUNTER — Other Ambulatory Visit (HOSPITAL_BASED_OUTPATIENT_CLINIC_OR_DEPARTMENT_OTHER): Payer: Self-pay

## 2023-10-06 MED ORDER — SILDENAFIL CITRATE 50 MG PO TABS
50.0000 mg | ORAL_TABLET | Freq: Every day | ORAL | 0 refills | Status: DC | PRN
  Filled 2023-10-06 (×2): qty 10, 10d supply, fill #0

## 2023-10-06 MED ORDER — AMLODIPINE BESYLATE 5 MG PO TABS
5.0000 mg | ORAL_TABLET | Freq: Every day | ORAL | 0 refills | Status: DC
  Filled 2023-10-06: qty 30, 30d supply, fill #0

## 2023-10-20 ENCOUNTER — Other Ambulatory Visit (HOSPITAL_BASED_OUTPATIENT_CLINIC_OR_DEPARTMENT_OTHER): Payer: Self-pay

## 2023-10-20 ENCOUNTER — Other Ambulatory Visit: Payer: Self-pay | Admitting: Medical

## 2023-10-20 MED ORDER — SILDENAFIL CITRATE 50 MG PO TABS
50.0000 mg | ORAL_TABLET | Freq: Every day | ORAL | 0 refills | Status: DC | PRN
  Filled 2023-10-20: qty 10, 10d supply, fill #0

## 2023-11-04 ENCOUNTER — Other Ambulatory Visit: Payer: Self-pay | Admitting: Medical

## 2023-11-05 ENCOUNTER — Other Ambulatory Visit (HOSPITAL_BASED_OUTPATIENT_CLINIC_OR_DEPARTMENT_OTHER): Payer: Self-pay

## 2023-11-05 MED ORDER — SILDENAFIL CITRATE 50 MG PO TABS
50.0000 mg | ORAL_TABLET | Freq: Every day | ORAL | 0 refills | Status: DC | PRN
  Filled 2023-11-05: qty 10, 10d supply, fill #0

## 2023-11-05 MED ORDER — AMLODIPINE BESYLATE 5 MG PO TABS
5.0000 mg | ORAL_TABLET | Freq: Every day | ORAL | 0 refills | Status: DC
  Filled 2023-11-05: qty 30, 30d supply, fill #0

## 2023-11-11 ENCOUNTER — Other Ambulatory Visit (HOSPITAL_BASED_OUTPATIENT_CLINIC_OR_DEPARTMENT_OTHER): Payer: Self-pay

## 2023-11-18 ENCOUNTER — Other Ambulatory Visit (HOSPITAL_BASED_OUTPATIENT_CLINIC_OR_DEPARTMENT_OTHER): Payer: Self-pay

## 2023-11-18 ENCOUNTER — Other Ambulatory Visit: Payer: Self-pay | Admitting: Medical

## 2023-11-29 ENCOUNTER — Other Ambulatory Visit: Payer: Self-pay | Admitting: Medical

## 2023-11-29 ENCOUNTER — Other Ambulatory Visit (HOSPITAL_BASED_OUTPATIENT_CLINIC_OR_DEPARTMENT_OTHER): Payer: Self-pay

## 2023-11-29 MED ORDER — SILDENAFIL CITRATE 50 MG PO TABS
50.0000 mg | ORAL_TABLET | Freq: Every day | ORAL | 0 refills | Status: DC | PRN
  Filled 2023-11-29: qty 10, 10d supply, fill #0

## 2023-12-16 ENCOUNTER — Other Ambulatory Visit: Payer: Self-pay | Admitting: Medical

## 2023-12-16 ENCOUNTER — Other Ambulatory Visit (HOSPITAL_BASED_OUTPATIENT_CLINIC_OR_DEPARTMENT_OTHER): Payer: Self-pay

## 2023-12-16 MED ORDER — SILDENAFIL CITRATE 50 MG PO TABS
50.0000 mg | ORAL_TABLET | Freq: Every day | ORAL | 0 refills | Status: DC | PRN
  Filled 2023-12-16: qty 10, 10d supply, fill #0

## 2023-12-28 ENCOUNTER — Other Ambulatory Visit: Payer: Self-pay | Admitting: Behavioral Health

## 2023-12-28 DIAGNOSIS — F411 Generalized anxiety disorder: Secondary | ICD-10-CM

## 2023-12-29 ENCOUNTER — Other Ambulatory Visit (HOSPITAL_BASED_OUTPATIENT_CLINIC_OR_DEPARTMENT_OTHER): Payer: Self-pay

## 2024-01-07 ENCOUNTER — Other Ambulatory Visit (HOSPITAL_BASED_OUTPATIENT_CLINIC_OR_DEPARTMENT_OTHER): Payer: Self-pay

## 2024-01-07 ENCOUNTER — Other Ambulatory Visit: Payer: Self-pay | Admitting: Medical

## 2024-01-17 ENCOUNTER — Other Ambulatory Visit: Payer: Self-pay

## 2024-01-17 ENCOUNTER — Other Ambulatory Visit: Payer: Self-pay | Admitting: Medical

## 2024-01-17 ENCOUNTER — Other Ambulatory Visit (HOSPITAL_BASED_OUTPATIENT_CLINIC_OR_DEPARTMENT_OTHER): Payer: Self-pay

## 2024-01-18 ENCOUNTER — Other Ambulatory Visit (HOSPITAL_BASED_OUTPATIENT_CLINIC_OR_DEPARTMENT_OTHER): Payer: Self-pay

## 2024-01-19 ENCOUNTER — Other Ambulatory Visit: Payer: Self-pay | Admitting: Medical

## 2024-01-19 ENCOUNTER — Other Ambulatory Visit (HOSPITAL_BASED_OUTPATIENT_CLINIC_OR_DEPARTMENT_OTHER): Payer: Self-pay

## 2024-01-21 ENCOUNTER — Other Ambulatory Visit: Payer: Self-pay | Admitting: Medical

## 2024-01-21 ENCOUNTER — Other Ambulatory Visit (HOSPITAL_BASED_OUTPATIENT_CLINIC_OR_DEPARTMENT_OTHER): Payer: Self-pay

## 2024-01-21 MED ORDER — SILDENAFIL CITRATE 50 MG PO TABS
50.0000 mg | ORAL_TABLET | Freq: Every day | ORAL | 0 refills | Status: DC | PRN
  Filled 2024-01-21: qty 10, 10d supply, fill #0

## 2024-01-27 ENCOUNTER — Other Ambulatory Visit: Payer: Self-pay | Admitting: Medical

## 2024-01-28 ENCOUNTER — Encounter (HOSPITAL_BASED_OUTPATIENT_CLINIC_OR_DEPARTMENT_OTHER): Payer: Self-pay

## 2024-01-28 ENCOUNTER — Other Ambulatory Visit (HOSPITAL_BASED_OUTPATIENT_CLINIC_OR_DEPARTMENT_OTHER): Payer: Self-pay

## 2024-01-31 ENCOUNTER — Other Ambulatory Visit: Payer: Self-pay | Admitting: Medical

## 2024-01-31 ENCOUNTER — Other Ambulatory Visit (HOSPITAL_BASED_OUTPATIENT_CLINIC_OR_DEPARTMENT_OTHER): Payer: Self-pay

## 2024-02-08 ENCOUNTER — Other Ambulatory Visit: Payer: Self-pay | Admitting: Medical

## 2024-02-08 ENCOUNTER — Other Ambulatory Visit: Payer: Self-pay

## 2024-02-08 ENCOUNTER — Other Ambulatory Visit (HOSPITAL_BASED_OUTPATIENT_CLINIC_OR_DEPARTMENT_OTHER): Payer: Self-pay

## 2024-02-11 ENCOUNTER — Other Ambulatory Visit: Payer: Self-pay | Admitting: Medical

## 2024-02-11 ENCOUNTER — Other Ambulatory Visit (HOSPITAL_BASED_OUTPATIENT_CLINIC_OR_DEPARTMENT_OTHER): Payer: Self-pay

## 2024-02-11 MED ORDER — AMLODIPINE BESYLATE 5 MG PO TABS
5.0000 mg | ORAL_TABLET | Freq: Every day | ORAL | 0 refills | Status: DC
  Filled 2024-02-11: qty 30, 30d supply, fill #0

## 2024-02-14 ENCOUNTER — Ambulatory Visit: Admitting: Medical

## 2024-02-14 ENCOUNTER — Other Ambulatory Visit (HOSPITAL_BASED_OUTPATIENT_CLINIC_OR_DEPARTMENT_OTHER): Payer: Self-pay

## 2024-02-14 ENCOUNTER — Encounter: Payer: Self-pay | Admitting: Medical

## 2024-02-14 VITALS — BP 153/80 | HR 80 | Temp 98.0°F | Resp 16 | Ht 69.0 in | Wt 225.0 lb

## 2024-02-14 DIAGNOSIS — I10 Essential (primary) hypertension: Secondary | ICD-10-CM

## 2024-02-14 DIAGNOSIS — F411 Generalized anxiety disorder: Secondary | ICD-10-CM | POA: Diagnosis not present

## 2024-02-14 DIAGNOSIS — F419 Anxiety disorder, unspecified: Secondary | ICD-10-CM

## 2024-02-14 DIAGNOSIS — Z79899 Other long term (current) drug therapy: Secondary | ICD-10-CM

## 2024-02-14 DIAGNOSIS — N528 Other male erectile dysfunction: Secondary | ICD-10-CM

## 2024-02-14 MED ORDER — SILDENAFIL CITRATE 50 MG PO TABS
50.0000 mg | ORAL_TABLET | Freq: Every day | ORAL | 0 refills | Status: DC | PRN
  Filled 2024-02-14: qty 10, 10d supply, fill #0

## 2024-02-14 MED ORDER — AMLODIPINE BESYLATE 5 MG PO TABS
5.0000 mg | ORAL_TABLET | Freq: Every day | ORAL | 3 refills | Status: AC
  Filled 2024-02-14 – 2024-03-22 (×2): qty 90, 90d supply, fill #0

## 2024-02-14 MED ORDER — CLONAZEPAM 0.5 MG PO TABS
0.5000 mg | ORAL_TABLET | Freq: Every day | ORAL | 1 refills | Status: AC | PRN
  Filled 2024-02-14: qty 20, 20d supply, fill #0

## 2024-02-14 NOTE — Patient Instructions (Addendum)
 Generalized Anxiety Disorder (GAD) Mild anxiety with preference for clonazepam . Agreed to use Lexapro  to reduce clonazepam  reliance. No adverse effects with Lexapro  previously. - Prescribe Lexapro  10 mg daily. - Prescribe clonazepam  0.5 mg, 30 tablets per month, as needed. - Conduct urine drug screen. - Sign medication contract.  Hypertension Elevated blood pressure. Goal: maintain below 140/90 mmHg. - Refill amlodipine  5 mg. - Continue lisinopril  40 mg.  General Health Maintenance Cholesterol levels pending. Dietary modifications advised for test preparation. - Advise to avoid meat and fried foods after 10 AM on cholesterol test day. - Check cholesterol levels.  ED -refilled viagra  rx.  Follow-up Schedule physical exam and review urine drug screen results. - Schedule physical exam on July 29th at 4 PM, pending availability. - Review urine drug screen results. - Contact him with schedule confirmation by Thursday.

## 2024-02-14 NOTE — Progress Notes (Signed)
   Subjective:    Patient ID: Joshua Hansen, male    DOB: 04/18/1981, 43 y.o.   MRN: [id number]  HPI Hx of anxiety in past. Previously need lexapro  and clonazepam . Eventually stopped both but now anxiety worsened over past 3 month along with depressed mood.   He has experienced increased anxiety over the past year. He has not been on any medications for anxiety or depression for almost a year. Previously, he was on clonazepam  0.5 mg once daily, which he found effective for his anxiety, and Lexapro  20 mg, which he also found helpful but prefers clonazepam . No adverse effects from Lexapro . His anxiety symptoms have recently worsened, and he describes anxiety more prominent than depressive symptoms. His GAD-7 score is 4, and his PHQ-9 score is 4.  He has a history of hypertension and is currently on lisinopril  40 mg daily. He was previously on amlodipine  5 mg, which was recently refilled but not yet picked up.  Also has ED and request refill of viagra .  Review of Systems See hpi    Objective:   Physical Exam  General- No acute distress. Pleasant patient. Neck- Full range of motion, no jvd Lungs- Clear, even and unlabored. Heart- regular rate and rhythm. Neurologic- CNII- XII grossly intact.       Assessment & Plan:   Patient Instructions  Generalized Anxiety Disorder (GAD) Mild anxiety with preference for clonazepam . Agreed to use Lexapro  to reduce clonazepam  reliance. No adverse effects with Lexapro  previously. - Prescribe Lexapro  10 mg daily. - Prescribe clonazepam  0.5 mg, 30 tablets per month, as needed. - Conduct urine drug screen. - Sign medication contract.  Hypertension Elevated blood pressure. Goal: maintain below 140/90 mmHg. - Refill amlodipine  5 mg. - Continue lisinopril  40 mg.  General Health Maintenance Cholesterol levels pending. Dietary modifications advised for test preparation. - Advise to avoid meat and fried foods after 10 AM on cholesterol  test day. - Check cholesterol levels.  ED -refilled viagra  rx.  Follow-up Schedule physical exam and review urine drug screen results. - Schedule physical exam on July 29th at 4 PM, pending availability. - Review urine drug screen results. - Contact him with schedule confirmation by Thursday.   Kees Idrovo, PA-C

## 2024-02-15 ENCOUNTER — Other Ambulatory Visit (HOSPITAL_BASED_OUTPATIENT_CLINIC_OR_DEPARTMENT_OTHER): Payer: Self-pay

## 2024-02-16 LAB — DM TEMPLATE

## 2024-02-16 LAB — DRUG MONITORING PANEL 376104, URINE
Amphetamines: NEGATIVE ng/mL (ref <500)
Barbiturates: NEGATIVE ng/mL (ref <300)
Benzodiazepines: NEGATIVE ng/mL (ref <100)
Cocaine Metabolite: NEGATIVE ng/mL (ref <150)
Desmethyltramadol: NEGATIVE ng/mL (ref <100)
Opiates: NEGATIVE ng/mL (ref <100)
Oxycodone: NEGATIVE ng/mL (ref <100)
Tramadol: NEGATIVE ng/mL (ref <100)

## 2024-02-24 ENCOUNTER — Other Ambulatory Visit (HOSPITAL_BASED_OUTPATIENT_CLINIC_OR_DEPARTMENT_OTHER): Payer: Self-pay

## 2024-02-24 ENCOUNTER — Other Ambulatory Visit: Payer: Self-pay | Admitting: Medical

## 2024-02-24 DIAGNOSIS — N528 Other male erectile dysfunction: Secondary | ICD-10-CM

## 2024-02-24 MED ORDER — SILDENAFIL CITRATE 50 MG PO TABS
50.0000 mg | ORAL_TABLET | Freq: Every day | ORAL | 0 refills | Status: DC | PRN
  Filled 2024-02-24: qty 4, 30d supply, fill #0
  Filled 2024-03-22: qty 4, 30d supply, fill #1
  Filled 2024-04-25: qty 2, 2d supply, fill #2

## 2024-03-22 ENCOUNTER — Other Ambulatory Visit: Payer: Self-pay

## 2024-03-22 ENCOUNTER — Other Ambulatory Visit (HOSPITAL_BASED_OUTPATIENT_CLINIC_OR_DEPARTMENT_OTHER): Payer: Self-pay

## 2024-03-22 ENCOUNTER — Other Ambulatory Visit: Payer: Self-pay | Admitting: Medical

## 2024-03-22 MED ORDER — LISINOPRIL 40 MG PO TABS
40.0000 mg | ORAL_TABLET | Freq: Every day | ORAL | 1 refills | Status: AC
  Filled 2024-03-22 – 2024-04-25 (×4): qty 90, 90d supply, fill #0

## 2024-03-23 ENCOUNTER — Other Ambulatory Visit (HOSPITAL_BASED_OUTPATIENT_CLINIC_OR_DEPARTMENT_OTHER): Payer: Self-pay

## 2024-03-27 ENCOUNTER — Other Ambulatory Visit (HOSPITAL_BASED_OUTPATIENT_CLINIC_OR_DEPARTMENT_OTHER): Payer: Self-pay

## 2024-04-06 ENCOUNTER — Other Ambulatory Visit (HOSPITAL_BASED_OUTPATIENT_CLINIC_OR_DEPARTMENT_OTHER): Payer: Self-pay

## 2024-04-25 ENCOUNTER — Other Ambulatory Visit (HOSPITAL_BASED_OUTPATIENT_CLINIC_OR_DEPARTMENT_OTHER): Payer: Self-pay

## 2024-06-08 ENCOUNTER — Other Ambulatory Visit: Payer: Self-pay | Admitting: Medical

## 2024-06-08 DIAGNOSIS — N528 Other male erectile dysfunction: Secondary | ICD-10-CM

## 2024-06-09 ENCOUNTER — Other Ambulatory Visit (HOSPITAL_BASED_OUTPATIENT_CLINIC_OR_DEPARTMENT_OTHER): Payer: Self-pay

## 2024-06-09 MED ORDER — SILDENAFIL CITRATE 50 MG PO TABS
50.0000 mg | ORAL_TABLET | Freq: Every day | ORAL | 0 refills | Status: AC | PRN
  Filled 2024-06-09: qty 4, 30d supply, fill #0
  Filled 2024-07-19: qty 4, 30d supply, fill #1
  Filled 2024-08-23: qty 2, 2d supply, fill #2

## 2024-06-11 ENCOUNTER — Encounter

## 2024-06-18 ENCOUNTER — Encounter

## 2024-06-21 ENCOUNTER — Ambulatory Visit: Admitting: Medical

## 2024-06-21 ENCOUNTER — Ambulatory Visit: Payer: Self-pay | Admitting: Medical

## 2024-06-21 ENCOUNTER — Encounter: Payer: Self-pay | Admitting: Medical

## 2024-06-21 VITALS — BP 138/86 | HR 68 | Temp 98.1°F | Resp 16 | Ht 69.0 in | Wt 220.0 lb

## 2024-06-21 DIAGNOSIS — R319 Hematuria, unspecified: Secondary | ICD-10-CM | POA: Diagnosis not present

## 2024-06-21 DIAGNOSIS — R39859 Costovertebral (angle) tenderness, unspecified side: Secondary | ICD-10-CM | POA: Diagnosis not present

## 2024-06-21 LAB — POCT URINALYSIS DIPSTICK
Bilirubin, UA: NEGATIVE
Blood, UA: NEGATIVE
Glucose, UA: NEGATIVE
Ketones, UA: NEGATIVE
Leukocytes, UA: NEGATIVE
Nitrite, UA: NEGATIVE
Protein, UA: NEGATIVE
Spec Grav, UA: 1.01 (ref 1.010–1.025)
Urobilinogen, UA: 0.2 U/dL
pH, UA: 6 (ref 5.0–8.0)

## 2024-06-21 NOTE — Progress Notes (Signed)
 Subjective:    Patient ID: Joshua Hansen, male    DOB: 15-Feb-1981, 44 y.o.   MRN: [id number]  HPI  Brylin Stanislawski is a 43 year old male who presents with hematuria associated with sexual activity.  He experiences hematuria on three occasions, each following sexual intercourse, with urine described as completely red. There is no dysuria, penile pain, or testicular pain. Hematuria is not daily but specifically follows sexual activity. The last episode was on Saturday. He has been with the same sexual partner for two years. There is no fever, frequent urination, or other urinary symptoms. Urination is normal on most days.  He notes some discomfort in the area over his kidneys, though he is unsure if it is related to his back. There is no pain between the scrotum and rectum.  He occasionally smokes, about one cigarette per week, for approximately one year.    Shows me small group non painful vein back of left leg. That has been present for 2 month. No change in appearance.  Review of Systems  Constitutional:  Negative for chills, fatigue and fever.  Respiratory:  Negative for cough, chest tightness and wheezing.   Cardiovascular:  Negative for chest pain and palpitations.  Gastrointestinal:  Negative for abdominal pain.  Genitourinary:  Positive for hematuria. Negative for flank pain, scrotal swelling and urgency.  Musculoskeletal:  Negative for back pain.       See hpi  Neurological:  Negative for dizziness.  Hematological:  Negative for adenopathy.  Psychiatric/Behavioral:  Negative for behavioral problems and dysphoric mood.     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   Essential hypertension 11/04/2021   Fracture of left humerus 08/07/2018    History of rheumatic fever 11/04/2021   Major depression, single episode 11/04/2021   Open fracture of left fibula and tibia 08/07/2018   Open fracture of left tibia 08/06/2018   Pain in limb 11/04/2021   Rheumatic fever    as child   Severe episode of recurrent major depressive disorder, without psychotic features (HCC) 06/14/2021   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   Vitamin D deficiency 11/04/2021     Social History   Socioeconomic History   Marital status: Married    Spouse name: Leretha   Number of children: 1   Years of education: 5th grade   Highest education level: Not on file  Occupational History   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  Spouse asked to leave after his ER visit and overdose. 4 days in hospital and then to treatment facility in [facility] name unknown, but stayed 7 days.    Social Drivers of Health   Financial Resource Strain: Low Risk  (06/25/2022)   Received from [facility]   Overall Financial Resource Strain (CARDIA)    Difficulty of Paying Living Expenses: Not very hard  Food Insecurity: Low Risk  (07/28/2022)  Received from [facility]   Hunger Vital Sign    Within the past 12 months, you worried that your food would run out before you got money to buy more: Never true    Within the past 12 months, the food you bought just didn't last and you didn't have money to get more: Not on file  Transportation Needs: No Transportation Needs (07/28/2022)   [facility]   Transportation    In the past 12 months, has lack of reliable transportation kept you from medical appointments, meetings, work or from  getting things needed for daily living? : No  Physical Activity: Not on file  Stress: Stress Concern Present (06/25/2022)   Received from [facility] of [facility] - Occupational Stress Questionnaire    Feeling of Stress : Rather much  Social Connections: Unknown (12/15/2021)   [facility] from [facility]   Social Network    Social Network: Not on file  Intimate Partner Violence: Low Risk  (07/28/2022)   [facility] prior to 10/03/2022.   Safety    How often does anyone, including family and friends, physically hurt you?: Never    How often does anyone, including family and friends, insult or talk down to you?: Never    How often does anyone, including family and friends, threaten you with harm?: Never    How often does anyone, including family and friends, scream or curse at you?: Never    Past Surgical History:  Procedure Laterality Date   I & D EXTREMITY Left 08/06/2018   Procedure: IRRIGATION AND DEBRIDEMENT EXTREMITY;  Surgeon: Sharl Selinda Dover, MD;  [facility];  Service: Orthopedics;  Laterality: Left;   TIBIA IM NAIL INSERTION Left 08/06/2018   Procedure: INTRAMEDULLARY (IM) NAIL TIBIAL;  Surgeon: Sharl Selinda Dover, MD;  Location: [facility] OR;  Service: Orthopedics;  Laterality: Left;    Family History  Problem Relation Age of Onset   Hyperlipidemia Mother    Hypertension Mother    Alcohol abuse Maternal Uncle     No Known Allergies  Current Outpatient Medications on File Prior to Visit  Medication Sig Dispense Refill   amLODipine  (NORVASC ) 5 MG tablet Take 1 tablet (5 mg total) by mouth daily. 90 tablet 3   aspirin  81 MG chewable tablet Chew one tablet (81 mg dose) by mouth daily for 28 days. 36 tablet 0   atorvastatin  (LIPITOR) 80 MG tablet Take one tablet (80 mg dose) by mouth at bedtime. 30 tablet 0   buPROPion  (WELLBUTRIN  XL) 300 MG 24 hr tablet Take 1 tablet (300 mg total) by mouth daily.  30 tablet 3   clonazePAM  (KLONOPIN ) 0.5 MG tablet Take 1 tablet (0.5 mg total) by mouth daily as needed for anxiety 20 tablet 1   clopidogrel  (PLAVIX ) 75 MG tablet Take one tablet (75 mg dose) by mouth daily for 30 days. 30 tablet 0   cyclobenzaprine  (FLEXERIL ) 5 MG tablet Take 1 tablet (5 mg) by mouth every night as needed for tension headache with neck pain. 5 tablet 0   escitalopram  (LEXAPRO ) 10 MG tablet Take 1 tablet (10 mg total) by mouth daily. 30 tablet 1   escitalopram  (LEXAPRO ) 10 MG tablet Take 1 tablet (10 mg total) by mouth at bedtime. 30 tablet 0   escitalopram  (LEXAPRO ) 20 MG tablet Take 1 tablet (20 mg total) by mouth daily. 30 tablet 2   levETIRAcetam (KEPPRA) 750 MG tablet Take  750 mg by mouth 2 (two) times daily.     lisinopril  (ZESTRIL ) 40 MG tablet Take 1 tablet (40 mg total) by mouth daily. 90 tablet 1   lithium  600 MG capsule Take 1 capsule (600 mg total) by mouth at bedtime. 30 capsule 2   melatonin 3 MG TABS tablet Take 3 mg by mouth at bedtime.     mirtazapine  (REMERON ) 15 MG tablet Take 1 tablet (15 mg total) by mouth at bedtime. 30 tablet 2   nicotine  polacrilex (NICORETTE ) 2 MG gum Take 1 (2 mg total) by mouth as needed for smoking cessation 270 each 0   sildenafil  (VIAGRA ) 50 MG tablet Take 1 tablet (50 mg total) by mouth daily as needed for erectile dysfunction. Maximum 2 tablets within 24 hours. 10 tablet 0   No current facility-administered medications on file prior to visit.    BP 138/86   Pulse 68   Temp 98.1 F (36.7 C)   Resp 16   Ht 5' 9 (1.753 m)   Wt 220 lb (99.8 kg)   SpO2 98%   BMI 32.49 kg/m        Objective:   Physical Exam  General- No acute distress. Pleasant patient. Neck- Full range of motion, no jvd Lungs- Clear, even and unlabored. Heart- regular rate and rhythm. Neurologic- CNII- XII grossly intact.  Back- faint cva tenderness vs para lumbar tenderness      Assessment & Plan:   Hematuria Intermittent post-coital  hematuria with significant blood. Differential includes trauma or underlying pathology. No dysuria, penile/testicular pain, or fever. - Ordered urine culture. - Referred to neurologist for further evaluation. -offered std screening lab. Declined presently.  Costovertebral angle pain Mild pain near kidneys, possble musculoskeletal. No fever or significant urinary symptoms. - Ordered urine culture.  Varicose veins -left leg small and superficial for months -consider use compression socks and elevated legs daily -review education information. -if ever tender let us  know.  Follow up date to be determined after lab review  Dallas Maxwell, PA-C

## 2024-06-21 NOTE — Patient Instructions (Signed)
 Hematuria Intermittent post-coital hematuria with significant blood. Differential includes trauma or underlying pathology. No dysuria, penile/testicular pain, or fever. - Ordered urine culture. - Referred to neurologist for further evaluation. -offered std screening lab. Declined presently.  Costovertebral angle pain Mild pain near kidneys, possble musculoskeletal. No fever or significant urinary symptoms. - Ordered urine culture.  Varicose veins -left leg small and superficial for months -consider use compression socks and elevated legs daily -review education information. -if ever tender let us  know.  Follow up date to be determined after lab review  Venas varicosas Varicose Veins Las venas varicosas son venas que se han agrandado y [place], y se han tornado sinuosas. Suelen aparecer en las piernas. Cules son las causas? La causa de esta afeccin es el dao en las vlvulas de la vena. Estas vlvulas ayudan a que la sangre regrese al corazn. Cuando estn daadas y dejan de funcionar correctamente, la sangre puede ir en direccin inversa y retornar a las venas cerca de la piel, lo que hace que las venas se agranden y se vean sinuosas. La afeccin puede surgir por cualquier factor que haga que la sangre retorne, como un embarazo, una actividad que obligue a estar de pie de forma prolongada o la obesidad. Qu incrementa el riesgo? Los siguientes factores pueden hacer que sea ms propenso a clinical cytogeneticist afeccin: Personal Assistant de veterinary surgeon. Estar embarazada. Tener sobrepeso. Fumar. Haber tenido una trombosis venosa profunda previa o tener un trastorno trombtico. Envejecimiento. El riesgo aumenta con la edad. Tener una afeccin llamada sndrome de [place]. Cules son los signos o sntomas? Los sntomas de esta afeccin incluyen: Venas abultadas, sinuosas y azuladas. Sensacin de general mills. Estos sntomas pueden empeorar hacia el final del da. Dolor  en las piernas. Estos sntomas pueden empeorar hacia el final del da. Hinchazn en la pierna. Cambios en el color de la piel que est [employer]. La hinchazn o chief technology officer en las piernas pueden limitar sus [place]. Los sntomas pueden empeorar al estar sentado o de pie durante largos perodos. Cmo se diagnostica? Esta afeccin se puede diagnosticar en funcin de lo siguiente: Sus sntomas, antecedentes familiares, niveles de actividad y [place] de vida. Un examen fsico. Tambin pueden hacerle estudios como una ecografa o radiografa. Cmo se trata? El tratamiento de esta afeccin puede incluir lo siguiente: Evitar estar sentado o de pie en la misma posicin durante mucho tiempo. Usar medias de compresin. Estas medias ayudan a transport planner formacin de cogulos de [place] y a reducir la hinchazn de las piernas. Levantar (elevar) las piernas al descansar. Bajar de [place]. Hacer actividad fsica con regularidad. Si tiene sntomas persistentes o desea mejorar el aspecto de las venas varicosas, puede optar por un procedimiento para anular dichas venas o extraerlas. Entre los tratamientos no quirrgicos para anular las venas, se incluyen los siguientes: Regulatory Affairs Officer. En este tratamiento, se inyecta una solucin en la vena para anularla. Tratamiento con lser. La vena se calienta con un lser para anularla. Ablacin venosa por radiofrecuencia. Se usa  una corriente elctrica que se produce mediante ondas de radio para anular la vena. Entre los tratamientos quirrgicos para extraer las venas, se incluyen los siguientes: Flebectoma. En este procedimiento, las venas se extraen a travs de pequeas incisiones que se realizan por encima de las venas. Ligadura venosa y varicectoma. En este procedimiento, se realizan incisiones por encima de las venas. Luego, las venas se extraen despus de atarse (ligarse) con puntos (suturas). Siga estas instrucciones en su casa: Medicamentos  Tome los  medicamentos de venta libre y los recetados solamente como se lo haya indicado el mdico. Si le recetaron un antibitico, tmelo como se lo haya indicado el mdico. No deje de usar el antibitico aunque comience a actor. Actividad [place] todo lo que pueda. Caminar aumenta el flujo sanguneo. Esto ayuda a que la sangre regrese al corazn y chief executive officer la presin en las venas. No permanezca de pie o sentado en una misma posicin durante mucho tiempo. No se siente con las piernas cruzadas. Evite estar sentado durante largos perodos sin moverse. Levntese y camine un poco cada 1 a 2 horas. Esto es importante para mejorar el flujo sanguneo y la respiracin. Pida ayuda si se siente dbil o inestable. Retome sus actividades normales segn lo indicado por el mdico. Pregntele al mdico qu actividades son seguras para usted. Haga ejercicio como se lo haya indicado el mdico. Instrucciones generales  Siga las instrucciones del mdico en lo que respecta a la dieta. De noche, eleve las piernas a una altura superior al nivel del corazn. Si se corta en la piel que est por encima de una vena varicosa y esta sangra: Recustese con la pierna levantada. Coloque un pao limpio sobre el corte y presione firmemente sobre l hasta que el sangrado se [place]. Aplique una venda (vendaje) sobre el corte. Beba suficiente lquido como para pharmacologist la orina de color amarillo plido. No consuma ningn producto que contenga nicotina o tabaco. Estos productos incluyen cigarrillos, tabaco para theatre manager y aparatos de vapeo, como los cigarrillos electrnicos. Si necesita ayuda para dejar de fumar, consulte al [employer]. Use medias de compresin como se lo haya indicado su mdico. No use otra clase de vestimenta ajustada alrededor de las piernas, la pelvis o la cintura. Concurra a todas las visitas de seguimiento. Esto es importante. Comunquese con un mdico si: La piel alrededor de las venas varicosas empieza a  lobbyist. Siente ms dolor o tiene enrojecimiento, sensibilidad o hinchazn dura sobre la vena. Est incmodo debido al dolor. Se corta en la piel de encima de una vena varicosa y no deja de geophysicist/field seismologist. Solicite ayuda de inmediato si: Midwife. Tiene dificultad para respirar. Siente dolor intenso en la pierna. Resumen Las venas varicosas son venas que se han agrandado y [place], y se han tornado sinuosas. Suelen aparecer en las piernas. La causa de esta afeccin es el dao en las vlvulas de la vena. Estas vlvulas ayudan a que la sangre regrese al corazn. El tratamiento de esta afeccin incluye hacer movimientos frecuentes, usar medias de compresin, perder peso y radio producer ejercicios de forma regular. En algunos casos, se realizan procedimientos que anulan o extraen las venas. Los tratamientos no quirrgicos para cerrar las venas incluyen escleroterapia, terapia lser y ablacin venosa por radiofrecuencia. Esta informacin no tiene theme park manager el consejo del mdico. Asegrese de hacerle al mdico cualquier pregunta que tenga. Document Revised: 01/20/2021 Document Reviewed: 01/20/2021 Elsevier Patient Education  2024 [employer].

## 2024-06-22 LAB — URINE CULTURE
MICRO NUMBER:: 17256764
Result:: NO GROWTH
SPECIMEN QUALITY:: ADEQUATE

## 2024-07-25 ENCOUNTER — Other Ambulatory Visit (HOSPITAL_BASED_OUTPATIENT_CLINIC_OR_DEPARTMENT_OTHER): Payer: Self-pay

## 2024-08-23 ENCOUNTER — Other Ambulatory Visit (HOSPITAL_BASED_OUTPATIENT_CLINIC_OR_DEPARTMENT_OTHER): Payer: Self-pay
